# Patient Record
Sex: Male | Born: 1990 | Smoking: Never smoker
Health system: Southern US, Community
[De-identification: ages and names within clinical notes are randomized; demographics above are authoritative.]

## PROBLEM LIST (undated history)

## (undated) DIAGNOSIS — M8430XA Stress fracture, unspecified site, initial encounter for fracture: Secondary | ICD-10-CM

## (undated) DIAGNOSIS — S93409A Sprain of unspecified ligament of unspecified ankle, initial encounter: Secondary | ICD-10-CM

## (undated) DIAGNOSIS — S6290XA Unspecified fracture of unspecified wrist and hand, initial encounter for closed fracture: Secondary | ICD-10-CM

## (undated) DIAGNOSIS — S8390XA Sprain of unspecified site of unspecified knee, initial encounter: Secondary | ICD-10-CM

## (undated) DIAGNOSIS — S060XAA Concussion with loss of consciousness status unknown, initial encounter: Secondary | ICD-10-CM

## (undated) DIAGNOSIS — S93609A Unspecified sprain of unspecified foot, initial encounter: Secondary | ICD-10-CM

## (undated) DIAGNOSIS — R21 Rash and other nonspecific skin eruption: Secondary | ICD-10-CM

## (undated) DIAGNOSIS — S63509A Unspecified sprain of unspecified wrist, initial encounter: Secondary | ICD-10-CM

## (undated) DIAGNOSIS — S46119A Strain of muscle, fascia and tendon of long head of biceps, unspecified arm, initial encounter: Secondary | ICD-10-CM

## (undated) HISTORY — DX: Sprain of unspecified ligament of unspecified ankle, initial encounter: S93.409A

## (undated) HISTORY — DX: Stress fracture, unspecified site, initial encounter for fracture: M84.30XA

## (undated) HISTORY — DX: Strain of muscle, fascia and tendon of long head of biceps, unspecified arm, initial encounter: S46.119A

## (undated) HISTORY — DX: Unspecified fracture of unspecified hand, initial encounter for closed fracture: S62.90XA

## (undated) HISTORY — DX: Unspecified sprain of unspecified foot, initial encounter: S93.609A

## (undated) HISTORY — DX: Unspecified sprain of unspecified wrist, initial encounter: S63.509A

## (undated) HISTORY — DX: Concussion with loss of consciousness status unknown, initial encounter: S06.0XAA

## (undated) HISTORY — DX: Sprain of unspecified site of unspecified knee, initial encounter: S83.90XA

## (undated) HISTORY — DX: Rash and other nonspecific skin eruption: R21

---
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
## 2016-10-17 ENCOUNTER — Other Ambulatory Visit: Payer: Self-pay | Admitting: Medical

## 2016-11-04 ENCOUNTER — Inpatient Hospital Stay
Payer: BC Managed Care – PPO | Attending: Orthopaedic Surgery | Admitting: Rehabilitative and Restorative Service Providers"

## 2016-11-04 DIAGNOSIS — M25571 Pain in right ankle and joints of right foot: Secondary | ICD-10-CM

## 2016-11-04 NOTE — PT/OT Therapy Note (Signed)
INITIAL EVALUATION (Ankle/Foot)    Name: David Macias Age: 25 y.o. Occupation: [employer] SOC: 11/04/16  Referring Physician: Madelin Headings, MD MD recheck: n/a DOS:   DOI: Onset of Problem / Injury: 09/12/16  # of Authorized Visits:   Visit #     Diagnosis (Treating/Medical):     ICD-10-CM    1. Acute right ankle pain M25.571         SUBJECTIVE:    Mechanism of Injury: Patient presents to the clinic with a chief complaint of right ankle pain. He was playing soccer in early October and sprained his left ankle. 3-4 days later his right ankle started hurting. He denies any trauma or injury to the right ankle. He thinks it may be due to overcompensation. The left ankle is fine now. In 2011 he had an ankle injury which required surgery on his left ankle (left subluxing peroneal tendon repair fibular osteotomy) and feels as if his symptoms in the right ankle are similar to that time. He was in a walking boot for 4 weeks and is out of it now. He did have an MRI which did not show any tearing or indications for surgery.     Pain location: lateral malleolus     Pain description: moments of sharp pain    Aggravating factors: lateral mobility, walking on uneven surfaces, sitting indian style criss-cross    Relieving factors: none/not needed    Functional limitations: patient is apprehensive with daily motions and is relatively limited for that reason. He is avoiding high level activity like running and playing sports to prevent further injury. (soccer player)    Patient's reason for seeking PT /Functional Limitations (PLOF): to get back to running and hiking without issues    Past Medical History: No past medical history on file.  Medications: No outpatient prescriptions have been marked as taking for the 11/04/16 encounter (Clinical Support) with Raechel Chute, PT.        Other Treatment/Prior Therapy: No  Prior Hospitalization: No  Hand Dominance: Dominant Hand: Right Involved Side: Involved Side: Right    DiagnosticTests: negative MRI    Outcome Measure:                         FAAM R Score: 68                % Pain Score: 10% Rate Satisfaction with Current Function: 3/10   Living Environment: Type of Residence: One story home/apartment      Dwelling Entrance:     Patient lives with: Living Arrangements: Roommate    OBJECTIVE:    Vitals:          Observation/Posture/Gait/Integumentary:  Observation of posture: Within Functional Limitations  Ambulation: without AD  Gait: Within functional limits  Integumentary: No wound, lesion or rash noted  Palpation: Pain to palpation: right ATFL and proximal extensor digitorum tendons as well as posterior tibialis muscle  Joint Mobility Assessment: hypomobile distal tib-fib with AP/PA with pain  End feel: Firm    Girth/Edema: None noted   Initial R  Initial L   Bimalleolar      Figure 8      Midfoot      Forefoot      (blank fields were intentionally left blank)    Range of Motion: (degrees)  Initial  Right  AROM Initial  Right PROM   Right AROM   Right PROM Ankle Initial  Left AROM  Initial  Left PROM   Left AROM   Left PROM   13    Dorsiflexion 12      59    Plantarflexion 64      32    Inversion 33      18    Eversion 18          Knee Flexion           Knee Extension       (blank fields were intentionally left blank)    Hip AROM: WFL  Knee AROM: WFL    Strength:   Initial  R   R LE Strength  MMT /5 Initial  L   L    5  Ankle Dorsiflexion 5    5  Ankle Plantarflexion 5    5  Ankle Inversion 5    5  Ankle Eversion 5      Quadriceps       Hamstrings     4  Hip Abduction 4    (blank fields were intentionally left blank)    Flexibility:    Comment:   Hamstrings NT    Quadriceps NT    Piriformis NT    ITBand NT    Iliopsoas NT    Gastroc NT      Functional Strength:   Sit to Stand: Independent  Squat:  NT  Step-up: NT  Heel Raises:  Bilateral: NT    Unilateral: R: 8 P! L: 25    Balance:  SLS R: Eyes Open (EO): 30 sec. Eyes Closed (EC): 30 sec.  SLS L: Eyes Open (EO): 30 sec. Eyes  Closed (EC): 30 sec.    Special Tests/Neurological Screen:   R L  R L   Anterior Drawer NT NT Windlass - -   Posterior Drawer NT NT Navicular Drop - -   Talar Tilt NT NT Thompsons - -   Valgus Stress NT NT  NT NT   Varus Stress NT NT        Deep Tendon Reflex R L   L3-L4 Patella NT NT   S1 Achilles NT NT    NT NT     Sensation to Light touch: Intact    Treatment Initial Visit:  Evaluation   Patient Education on PT, POC, and HEP  Therapeutic exercise with instruction in HEP and provided patient written and illustrated handout Yes  Therapeutic Activity: N/A  Manual: N/A  Modalities: None  Barriers to rehabilitation: None  Is patient aware of diagnosis: Yes  For Next Visit Add manual and therex as indicated      Margaretmary Dys, PT, DPT [phone] 11/04/2016      11/04/2016   Total Treatment (billable)Time: 40  Total Timed Minutes:  10    Plan of Care [facility] Medicare Provider #: [phone]                Patient Name: David Macias  MRN: [id number]  DOI: Onset of Problem / Injury: 09/12/16 DOS: N/A  SOC:11/04/16    Diagnosis:     ICD-10-CM    1. Acute right ankle pain M25.571        ASSESSMENT: the patient is a 25 y.o. male presenting with right ankle pain consistent with his referral of ankle sprain who requires Physical Therapy for the following:  Impairments: soft-tissue restrictions over the ATFL and proximal extensor digitorum, hypomobile distal tib-fib with pain, trigger points in the posterior tibialis, glute weakness    Barriers to  Rehabilitations/Comorbidities/personal  factors:  none    Pain located: right lateral ankle    Clinical presentation: stable     Functional Limitations (PLOF): patient is apprehensive with daily motions and is relatively limited for that reason. He is avoiding high level activity like running and playing sports to prevent further injury. (soccer player)    Plan Of Care: Body Mechanics Education, NMR, Proprioceptive Activites, Instruction in HEP, Therapeutic Exercise, Therapeutic Activities,  Balance/Gait training and Soft Tissue/Joint Mobilization grades 1-4 to right ankle/foot as indicated    Frequency/Duration: 2 times a week for 12 sessions. Certification Status Ends: 01/02/17    Goals:  Date (Body Area, Impairment Goal, Functional   Activity, Target Performance) Time Frame Status Date/  Initial   11/04/16   Patient will demonstrate independence in prescribed HEP with proper form, sets and reps for safe discharge to an independent program.  12 sessions Initial Eval    11/04/16   Improve FAAM to 84  12 sessions Initial Eval    11/04/16   Patient will be able to perform typical ADL's without any apprehension including walking/commuting to work.  10 sessions Initial Eval    11/04/16   Patient will be able to negotiate hills of uneven surfaces in order to hike without restriction.  12 sessions Initial Eval    11/04/16   Patient will be able to perform SL hopping drills in order to participate in sports.  10 sessions Initial Eval      Signature: Margaretmary Dys, [place], [place] [id number] 11/04/2016     Date: 11/04/2016    Signature: Madelin Headings, MD _______________________  Date:  ________________     Patient Name: David Macias  MRN: [id number]

## 2016-11-04 NOTE — PT/OT Exercise Plan (Signed)
Name: David Macias  Referring Physician: Madelin Headings, MD  Diagnosis:     ICD-10-CM    1. Acute right ankle pain M25.571         Precautions:   Date of Surgery:    MD Follow-up:           Exercise Flow Sheet    Exercise Specifics 11/04/16             Bike                  SL heel raises  15x  ak             Hip abd  10x  ak             clamshell    10x  ak             gastroc/soleus stretch               Foam roll   calf                                                                                                   Home Exercise Program                 (Initials = supervised exercise by clinician)    [name]  Access Code: [email or web address]   URL: [email or web address]   Date: 11/04/2016   Prepared by: Margaretmary Dys     Exercises   Single Leg Heel Raise - 15 reps - 3 sets - 2x daily - 7x weekly   Sidelying Hip Abduction - 15 reps - 3 sets - 2x daily - 7x weekly   Clamshell - 15 reps - 3 sets - 2x daily - 7x weekly

## 2016-11-06 ENCOUNTER — Inpatient Hospital Stay
Payer: BC Managed Care – PPO | Attending: Orthopaedic Surgery | Admitting: Rehabilitative and Restorative Service Providers"

## 2016-11-06 DIAGNOSIS — M25571 Pain in right ankle and joints of right foot: Secondary | ICD-10-CM

## 2016-11-06 NOTE — PT/OT Therapy Note (Signed)
DAILY NOTE   11/06/2016        Total Treatment (billable)Time: 35   Total Timed Minutes:  35 Visit Number:  2/12      Payor: OUT OF STATE BLUE CROSS / Plan: BCBS OUT OF STATE / Product Type: *No Product type* /    # of Authorized Visits:   Visit #:        Diagnosis (Treating/Medical):     ICD-10-CM    1. Acute right ankle pain M25.571            Subjective:  David Macias reports he got a chance to do his HEP and noticed the hip weakness.   Functional Status: same as above    Objective:   Treatment:  Therapeutic Exercise: to improve: Balance, Flexibility/ROM, Stabilization and Strength   Warm-up:  None today -   Modifications/Patient Education:  Verbal, Visual and Tactile cues for therex  PROM rear foot into inversion/eversion with end-range OP    NMR:    SLS on airex - progression from EO to EC  SL hip/ankle strategy    Therapeutic Activities:    ---    Manual Therapy:   AP/PA to proximal and distal tib-fib joints grades 3-4      Initial Evaluation Reference and/orCurrent Measurements (ROM, Strength, Girth, Outcomes, etc.):      Range of Motion: (degrees)  Initial  Right  AROM Initial  Right PROM    Right AROM    Right PROM Ankle Initial  Left AROM Initial  Left PROM    Left AROM    Left PROM   13       Dorsiflexion 12         59       Plantarflexion 64         32       Inversion 33         18       Eversion 18                 Knee Flexion                   Knee Extension           (blank fields were intentionally left blank)      Strength:   Initial  R    R LE Strength  MMT /5 Initial  L    L    5   Ankle Dorsiflexion 5     5   Ankle Plantarflexion 5     5   Ankle Inversion 5     5   Ankle Eversion 5         Quadriceps           Hamstrings       4   Hip Abduction 4     (blank fields were intentionally left blank)      Modalities: None  Therapy Rationale: Other:         Assessment (response to treatment):   David Macias tolerated the session well. He has some posterior pain behind the distal tib-fib joint which may be as a  result of hypomobility. Following mobilizations he had less pain with OP in dorsiflexion. He has pretty good balance but may benefit from proprioception training. Patient arrived 10 mins tardy limiting the session today.     Progress towards functional goals:    Goals:  Date (Body Area, Impairment Goal, Functional   Activity, Target Performance)  Time Frame Status Date/  Initial   11/04/16   Patient will demonstrate independence in prescribed HEP with proper form, sets and reps for safe discharge to an independent program.  12 sessions In progress 11/06/16  ak   11/04/16   Improve FAAM to 84  12 sessions Initial Eval     11/04/16   Patient will be able to perform typical ADL's without any apprehension including walking/commuting to work.  10 sessions Initial Eval     11/04/16   Patient will be able to negotiate hills of uneven surfaces in order to hike without restriction.  12 sessions Initial Eval     11/04/16   Patient will be able to perform SL hopping drills in order to participate in sports.  10 sessions Initial Eval        Patient requires continued skilled care to achieve the following Functional Limitations (PLOF): patient is apprehensive with daily motions and is relatively limited for that reason. He is avoiding high level activity like running and playing sports to prevent further injury. (soccer player)    Plan:  Continue with Plan of Care. Continue with joint mobilizations as tolerated.     Margaretmary Dys, PT, DPT [phone] 11/06/2016    Frequency/Duration: 2 times a week for 12 sessions.        Certification Status Ends: 01/02/17

## 2016-11-06 NOTE — PT/OT Exercise Plan (Signed)
Name: David Macias  Referring Physician: Madelin Headings, MD  Diagnosis:     ICD-10-CM    1. Acute right ankle pain M25.571         Precautions:   Date of Surgery:    MD Follow-up:           Exercise Flow Sheet    Exercise Specifics 11/04/16 11/06/16            Bike                  SL heel raises  15x  ak             Hip abd  10x  ak             clamshell    10x  ak             gastroc/soleus stretch 1/2 foam  CKC DF 30x  ak             Foam roll   calf              baps     10x CW and CCW                                                                                Home Exercise Program                 (Initials = supervised exercise by clinician)    [name]  Access Code: [email or web address]   URL: [email or web address]   Date: 11/04/2016   Prepared by: Margaretmary Dys     Exercises   Single Leg Heel Raise - 15 reps - 3 sets - 2x daily - 7x weekly   Sidelying Hip Abduction - 15 reps - 3 sets - 2x daily - 7x weekly   Clamshell - 15 reps - 3 sets - 2x daily - 7x weekly

## 2016-11-07 HISTORY — PX: CYSTECTOMY, PILONIDAL: SHX3546

## 2016-11-11 ENCOUNTER — Inpatient Hospital Stay: Payer: BC Managed Care – PPO

## 2016-11-13 ENCOUNTER — Inpatient Hospital Stay: Payer: BC Managed Care – PPO | Attending: Orthopaedic Surgery

## 2016-11-13 DIAGNOSIS — M25571 Pain in right ankle and joints of right foot: Secondary | ICD-10-CM

## 2016-11-13 NOTE — PT/OT Exercise Plan (Signed)
Name: [name]  Referring Physician: Madelin Headings, MD  Diagnosis:     ICD-10-CM    1. Acute right ankle pain M25.571         Precautions:   Date of Surgery:    MD Follow-up:           Exercise Flow Sheet    Exercise Specifics 11/04/16 11/06/16 11/13/16           Bike       -           SL heel raises  15x  ak  DL HR off step  [id number]  ck           Hip abd  10x  ak  GTB s/s x2 laps  ck           clamshell    10x  ak  -           gastroc/soleus stretch 1/2 foam  CKC DF 30x  ak  2x30" ea  ck           Foam roll   calf   B calf x2'  ck           baps     10x CW and CCW R foot diagonal on tilt board   w/ taps   x1'  ck                 SLS on airex+ball toss x30  ck                 SL cone touch x15  ck                                             Home Exercise Program                 (Initials = supervised exercise by clinician)    [name]  Access Code: [email or web address]   URL: [email or web address]   Date: 11/04/2016   Prepared by: Margaretmary Dys     Exercises   Single Leg Heel Raise - 15 reps - 3 sets - 2x daily - 7x weekly   Sidelying Hip Abduction - 15 reps - 3 sets - 2x daily - 7x weekly   Clamshell - 15 reps - 3 sets - 2x daily - 7x weekly

## 2016-11-13 NOTE — PT/OT Therapy Note (Signed)
DAILY NOTE   11/13/2016        Total Treatment (billable)Time: 45' Total Timed Minutes:  1' Visit Number:  3/12      Payor: OUT OF STATE BLUE CROSS / Plan: BCBS OUT OF STATE / Product Type: *No Product type* /    # of Authorized Visits:   Visit #:        Diagnosis (Treating/Medical):     ICD-10-CM    1. Acute right ankle pain M25.571        Subjective:  David Macias reports he was sore for about 4 days since his last appointment, ankle is feeling better today.   Functional Status: same as above    Objective:   Treatment:  Therapeutic Exercise: to improve: Balance, Flexibility/ROM, Stabilization and Strength   Warm-up:  None today -   Modifications/Patient Education:  Verbal, Visual and Tactile cues for therex  PROM of ankle in all dir to tolerance; asst GN stretch in long sit    NMR:    sls on airex+ball tosses for proprioception and balance  Tilt board for stabilization and motor control    Therapeutic Activities:    ---    Manual Therapy:   AP/PA to distal tib-fib joints grades 3-4      Initial Evaluation Reference and/orCurrent Measurements (ROM, Strength, Girth, Outcomes, etc.):      Range of Motion: (degrees)  Initial  Right  AROM Initial  Right PROM    Right AROM    Right PROM Ankle Initial  Left AROM Initial  Left PROM    Left AROM    Left PROM   13       Dorsiflexion 12         59       Plantarflexion 64         32       Inversion 33         18       Eversion 18                 Knee Flexion                   Knee Extension           (blank fields were intentionally left blank)      Strength:   Initial  R    R LE Strength  MMT /5 Initial  L    L    5   Ankle Dorsiflexion 5     5   Ankle Plantarflexion 5     5   Ankle Inversion 5     5   Ankle Eversion 5         Quadriceps           Hamstrings       4   Hip Abduction 4     (blank fields were intentionally left blank)      Modalities: None  Therapy Rationale: Other:         Assessment (response to treatment):   David Macias tolerated the session well. He had some tenderness  during tib-fib mobilizations but overall tolerable. No significant pain with exercises today but challenged with some fatigue during sls on airex. He was encouraged to apply ice to his ankle at home to reduce soreness after PT sessions.     Progress towards functional goals:    Goals:  Date (Body Area, Impairment Goal, Functional   Activity, Target Performance) Time Frame  Status Date/  Initial   11/04/16   Patient will demonstrate independence in prescribed HEP with proper form, sets and reps for safe discharge to an independent program.  12 sessions In progress 11/06/16  ak   11/04/16   Improve FAAM to 84  12 sessions Initial Eval     11/04/16   Patient will be able to perform typical ADL's without any apprehension including walking/commuting to work.  10 sessions Initial Eval     11/04/16   Patient will be able to negotiate hills of uneven surfaces in order to hike without restriction.  12 sessions Initial Eval     11/04/16   Patient will be able to perform SL hopping drills in order to participate in sports.  10 sessions Initial Eval        Patient requires continued skilled care to achieve the following Functional Limitations (PLOF): patient is apprehensive with daily motions and is relatively limited for that reason. He is avoiding high level activity like running and playing sports to prevent further injury. (soccer player)    Plan:  Continue with Plan of Care. Continue with joint mobilizations as tolerated.     John Giovanni, [place]  [id number] 11/13/2016    Frequency/Duration: 2 times a week for 12 sessions.        Certification Status Ends: 01/02/17

## 2016-11-18 ENCOUNTER — Inpatient Hospital Stay: Payer: BC Managed Care – PPO

## 2016-11-21 ENCOUNTER — Inpatient Hospital Stay: Payer: BC Managed Care – PPO | Admitting: Rehabilitative and Restorative Service Providers"

## 2016-11-26 ENCOUNTER — Inpatient Hospital Stay: Payer: BC Managed Care – PPO

## 2017-02-05 IMAGING — CT CT ABD-PELV W/ CM
2 of 4 series · 15 of 46 positions shown, 17 images · IV contrast (APPLIED)
Comparison: None.

CLINICAL DATA: Left lower quadrant pain for 4 weeks

EXAM:
CT ABDOMEN AND PELVIS WITH CONTRAST
TECHNIQUE: Multidetector CT imaging of the abdomen and pelvis was performed
using the standard protocol following bolus administration of
intravenous contrast.
CONTRAST:  100mL [id number] IOPAMIDOL ([id number]) INJECTION 61%

[Series 2: abd/pelvis w/cm · axial · 0.66mm/px · z∈[-452,-67]mm · 12 of 93 slices shown, 14 images]
[im 8/93  soft-tissue]
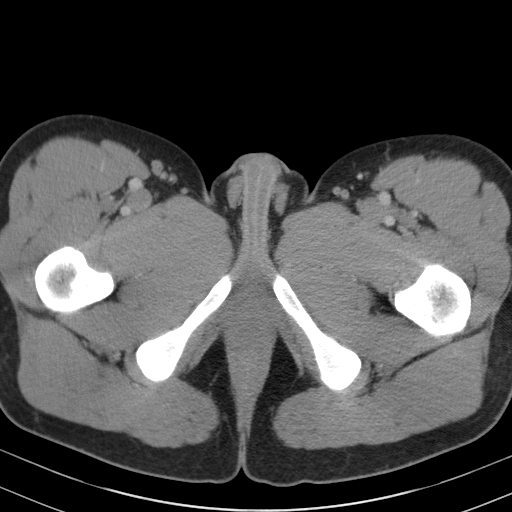
[im 8/93  bone]
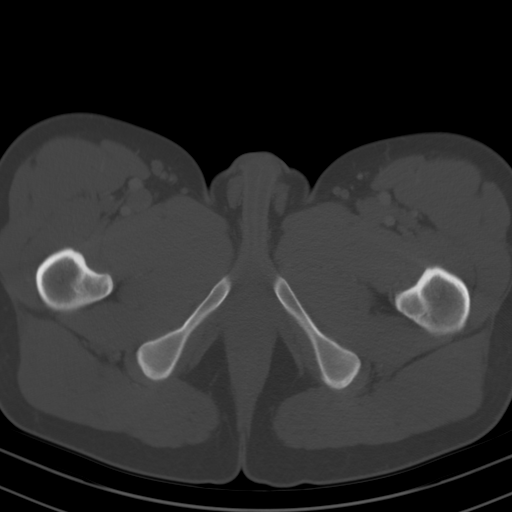
[im 15/93  soft-tissue]
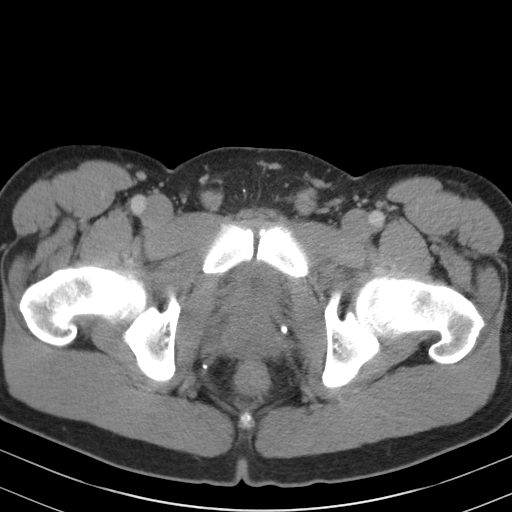
[im 22/93  soft-tissue]
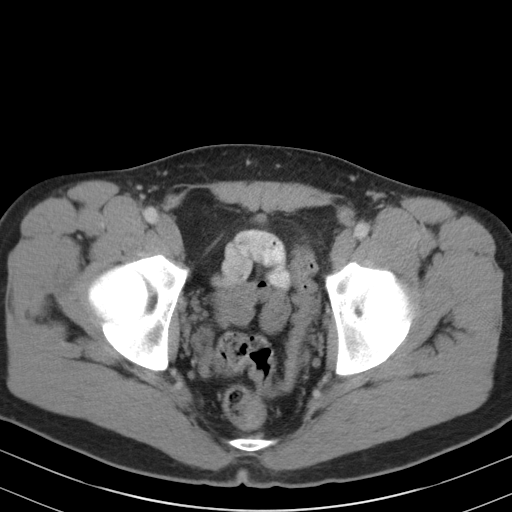
[im 29/93  soft-tissue]
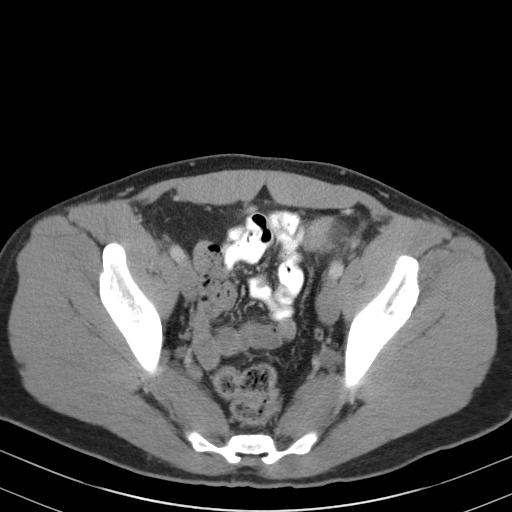
[im 36/93  soft-tissue]
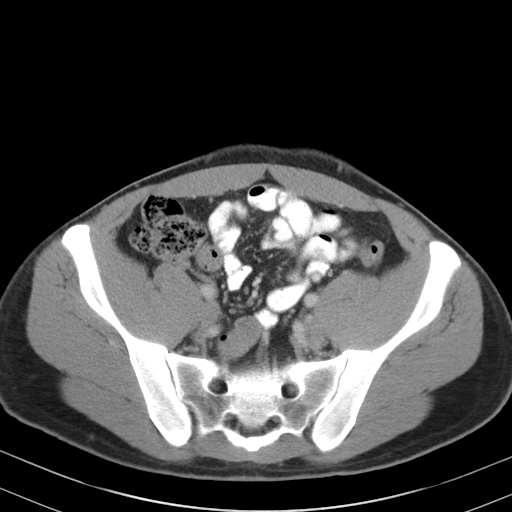
[im 43/93  soft-tissue]
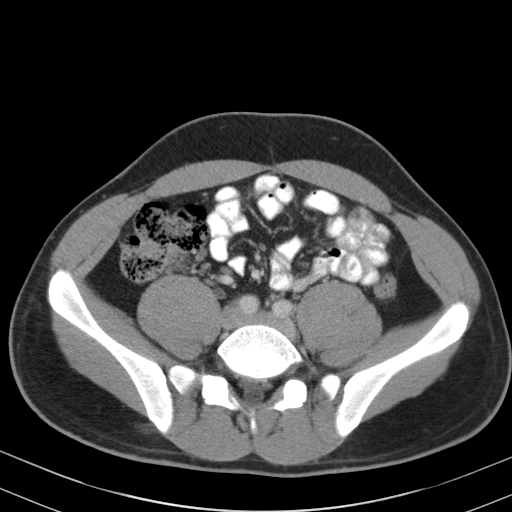
[im 50/93  soft-tissue]
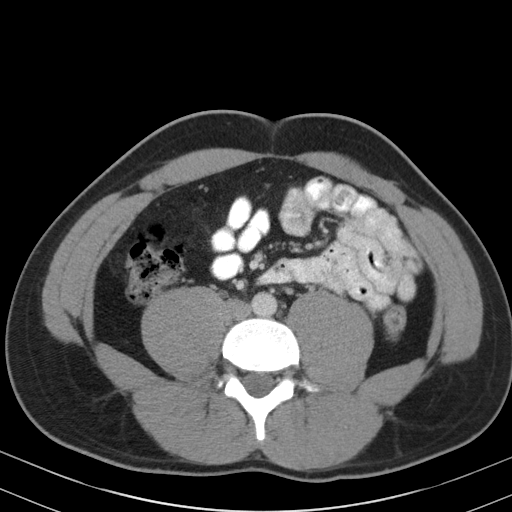
[im 57/93  soft-tissue]
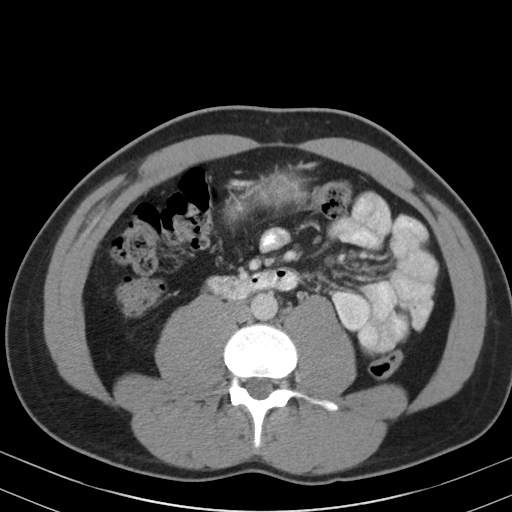
[im 64/93  soft-tissue]
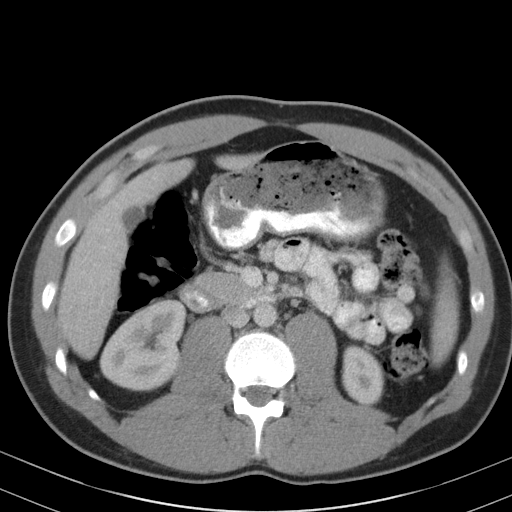
[im 64/93  bone]
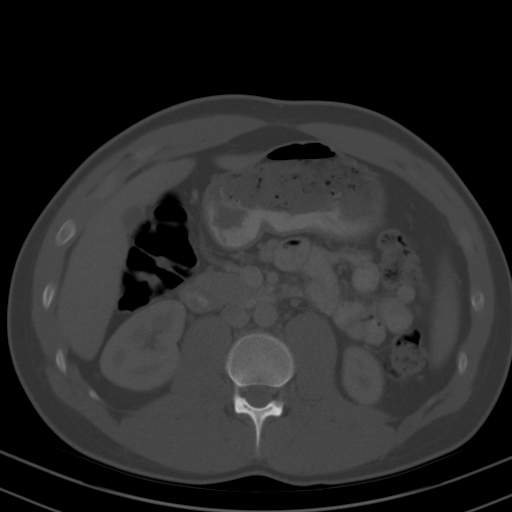
[im 71/93  soft-tissue]
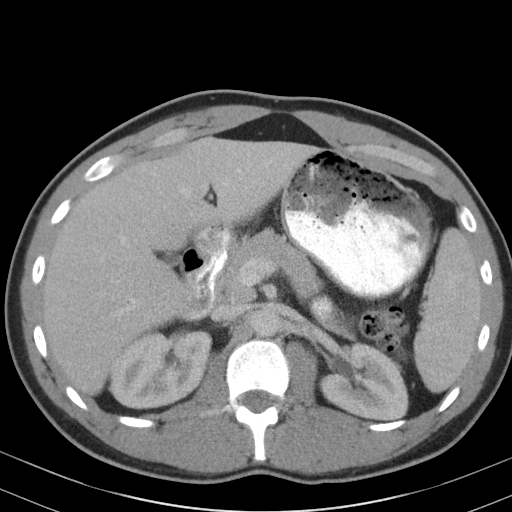
[im 78/93  soft-tissue]
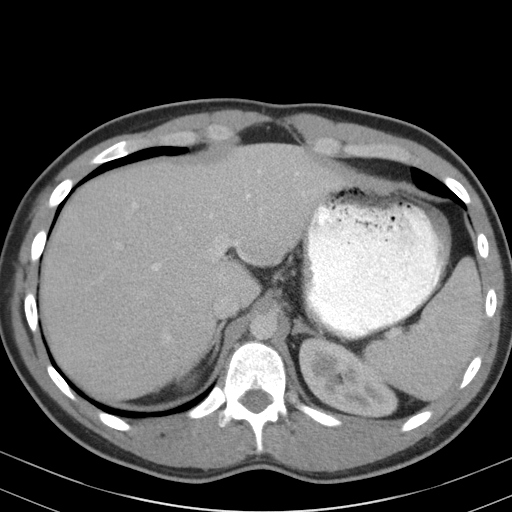
[im 85/93  soft-tissue]
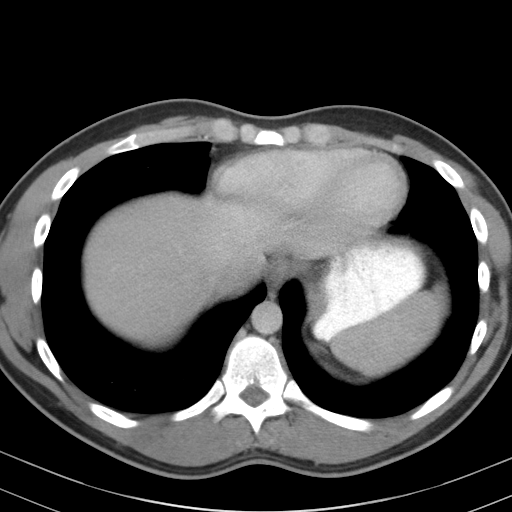

[Series 3: cor · coronal · 0.69mm/px · 3 of 89 slices shown]
[im 30/89  soft-tissue]
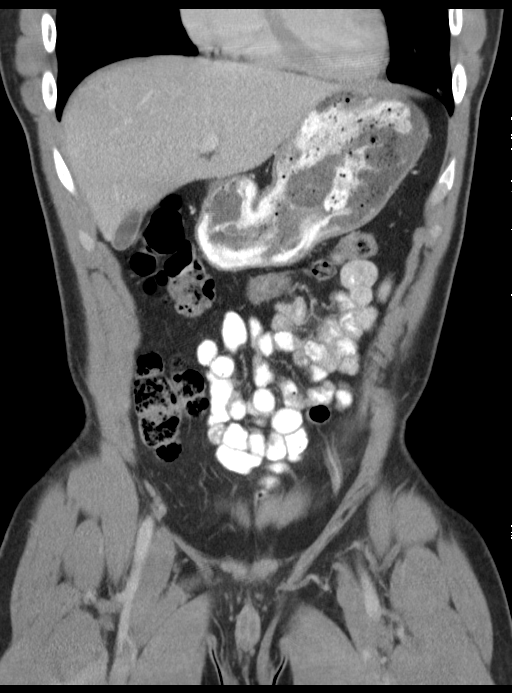
[im 40/89  soft-tissue]
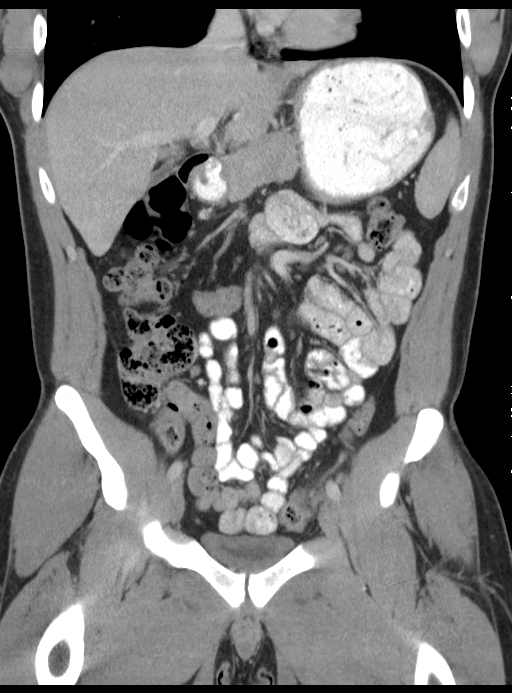
[im 49/89  soft-tissue]
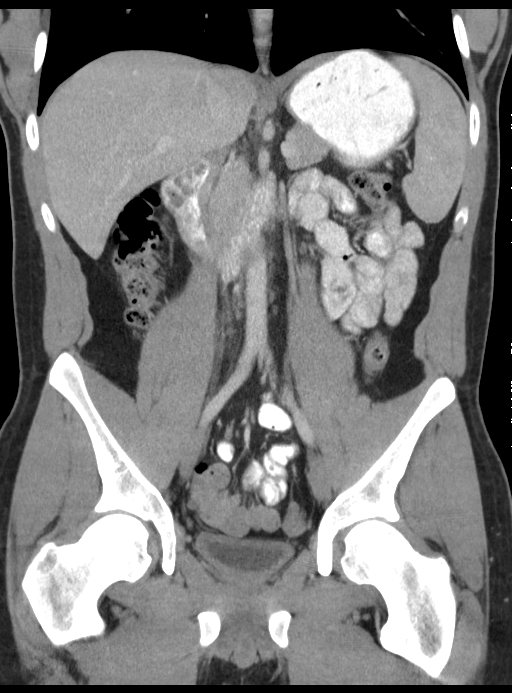

[15 of 46 positions shown; findings below may reference images not displayed]

FINDINGS: Lung bases are unremarkable. Sagittal images of the spine are
unremarkable.

Enhanced liver is unremarkable. Gallbladder is contracted without
evidence of calcified gallstones.

Enhanced pancreas, spleen and adrenal glands are unremarkable.
Abdominal aorta is unremarkable. There is no gastric outlet
obstruction.

Enhanced kidneys are symmetrical in size. No hydronephrosis or
hydroureter. No thickened or dilated small bowel loops are noted.

The terminal ileum is unremarkable. Moderate stool noted in right
colon cecum. No pericecal inflammation. Small nonspecific lymph
nodes are noted in right lower quadrant mesentery. Normal appendix
is partially visualized in axial image 57 and sagittal image 43.

In axial image 67 there is focal inflammatory process in left lower
quadrant mesentery just inferolateral to proximal sigmoid colon.
There is ring like stranding of mesenteric fat with ovoid central
fat. Measures about 1.6 x 1.2 cm. This is best seen in coronal image
35. Small amount of fluid is noted in adjacent posterolateral
paracolic gutter. This most likely represents epiploic appendagitis,
focal mesenteric panniculitis or less likely diverticulitis or focal
colitis. There is no evidence of mesenteric or diverticular abscess.
No significant colonic diverticula are noted. No evidence of free
abdominal air.

There is no distal colonic obstruction. Prostate gland and seminal
vesicles are unremarkable. There is nonspecific thickening of
urinary bladder wall. Although the urinary bladder is under
distended cystitis cannot be excluded. Clinical correlation is
necessary.
IMPRESSION: 1. In axial image 67 there is focal inflammatory process in left
lower quadrant mesentery just inferolateral to proximal sigmoid
colon. There is ring like stranding of mesenteric fat with ovoid
central fat. Measures about 1.6 x 1.2 cm. This is best seen in
coronal image 35. Small amount of fluid is noted in adjacent
posterolateral paracolic gutter. This most likely represents
epiploic appendagitis, focal mesenteric panniculitis or less likely
diverticulitis or focal colitis. There is no evidence of mesenteric
or diverticular abscess.
2. Nonspecific mild thickening of urinary bladder wall. Mild
cystitis cannot be excluded.
3. Moderate stool noted in right colon and cecum. No pericecal
inflammation. Normal appendix partially visualized.
4. No hydronephrosis or hydroureter.
5. No small bowel obstruction.

## 2017-07-31 ENCOUNTER — Other Ambulatory Visit (INDEPENDENT_AMBULATORY_CARE_PROVIDER_SITE_OTHER): Payer: Self-pay | Admitting: Family Medicine

## 2017-07-31 LAB — COMPREHENSIVE METABOLIC PANEL
ALT: 17 U/L (ref 10–50)
AST (SGOT): 17 U/L (ref 0–40)
African American eGFR: 106.23
Albumin: 4.7 g/dL (ref 3.5–5.2)
Alkaline Phosphatase: 50 U/L (ref 35–130)
BUN / Creatinine Ratio: 12.9
BUN: 13 mg/dL (ref 6–23)
Bilirubin, Total: 1 mg/dL (ref 0.0–1.2)
CO2: 25 mmol/L (ref 19–31)
Calcium: 9.6 mg/dL (ref 8.6–10.2)
Chloride: 104 mEq/l (ref 97–107)
Creatinine: 1.04 mg/dL (ref 0.70–1.20)
Glucose: 88 mg/dL (ref 74–106)
Potassium: 4.4 mmol/L (ref 3.5–5.1)
Protein, Total: 6.82 g/dL (ref 6.30–8.70)
Sodium: 139 mmol/L (ref 135–145)
non-African American eGFR: 87.79

## 2017-07-31 LAB — LIPID PANEL
Cholesterol / HDL Ratio: 3.5 (ref 0.0–3.6)
Cholesterol: 167 mg/dL (ref 0–200)
HDL: 48 mg/dL (ref >40)
LDL Calculated: 105 mg/dL (ref 0–129)
NON HDL CHOLESTEROL: 120 mg/dL
Triglycerides: 75 mg/dL (ref 0–150)

## 2017-09-03 ENCOUNTER — Inpatient Hospital Stay: Payer: BC Managed Care – HMO | Attending: Family Medicine

## 2017-09-03 DIAGNOSIS — S86899D Other injury of other muscle(s) and tendon(s) at lower leg level, unspecified leg, subsequent encounter: Secondary | ICD-10-CM | POA: Insufficient documentation

## 2017-09-03 NOTE — Progress Notes (Signed)
Name: David Macias Age: 26 y.o.   Referring Physician: Miles Costain, *   Date of Injury: 07/31/2017  Date Care Plan Established/Reviewed: 09/03/2017  Date Treatment Started: 09/03/2017  Visit Count: 1   Diagnosis:   1. Shin splints, subsequent encounter        Subjective     History of Present Illness   Mechanism of injury: Insidious  History of Present Illness: Patient reports he has been battling shin splint for years. Has gotten X rays; which were unremarkable. Currently not running but would like to start; plays basketball, soccer. Would like to do 5 k's. No paresthesia in LE. During workout may feel sharp pain in both shin and then dull ache thoroughout. Does dynamic warm ups and stretches after his workouts. Use to wear custom inserts (custom made); not wearing them anymore.   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 Stand: independent  Squat: full (SL squats full; loss of balance on R)    Balance   Left   Eyes Open (Left)(sec)      Trial 1: 30 seconds  Right   Eyes Open (Right)(sec)      Wobbles more on R; lateral column loading     Trial 1: 30 seconds    Gait   Within Functional Limits    Integumentary   no wound, lesion or rash noted    Range of Motion   Left Hip AROM WFL Right Hip AROM WFL Left Knee AROM WFL Right Knee AROM WFL       Left AROM    Left PROM   Ankle/Foot    Right AROM    Right PROM   20    Dorsiflexion 20      55    Plantarflexion 60      42    Ankle Inversion 41      4    Ankle Eversion 10      (blank fields were intentionally left blank)      Strength        Left Strength  Hip  MMT    Right    5 Hip Flexion  5    4+ Hip Extension  4+    4 Hip Abduction  4     Hip Adduction       Hip IR      5 Hip ER  4+    5  Quadriceps  5    5 Hamstrings  5   (blank fields were intentionally left blank)       Left Strength  Ankle/Foot  MMT    Right    5 Ankle Dorsiflexion  5     Ankle Plantarflexion      5 Ankle Inversion  5    5 Ankle Eversion  5   (blank fields were intentionally left blank)    Palpation Medial gastroc tenderness             Treatment     Therapeutic Exercises   Justification: Create and updated HEP  [name]   Access Code: [email or web address]   URL: [email or web address]   Date: 09/03/2017  Prepared by: Harlon Ditty      Exercises Sidelying Hip Abduction - 10 reps - 3 sets - 4x weekly   Single Leg Bridge - 15 reps - 1 sets - 4x weekly   Sidelying IT Band Foam Roll Mobilization - 1 reps - 1 sets - 4x weekly   Anterior Tibialis and Peroneal Mobilization with Foam Roll - 1 reps - 1 sets - 4x weekly   Calf Mobilization on Foam Roll - 1 reps - 1 sets - 4x weekly       Therapeutic Activity   Justification: Education  -Patient diagnosis, POC and importance of running mechanics and changing shoes every 400 miles       ---      ---   Total Time   Timed Minutes  20 minutes   Untimed Minutes  20 minutes   Total Time  40 minutes        Assessment   David Macias is a 26 y.o. male presenting with bilateral shin splints  who requires Physical Therapy for the following:  Impairments: glut weakness, R dynamic balance deficits, tenderness to palpation (medial calf)    Pain located: medial calf    Clinical presentation: stable   Barriers to therapy: none  Prior Level of Function: Running more than 1/2 mile causes pain  Prognosis: good  Plan   Visits per week: 2  Number of Sessions: 8  Direct One on One  16109: Therapeutic Exercise: To Develop Strength and Endurance, ROM and Flexibility  [id number]: Neuromuscular Reeducation  97140: Manual Therapy techniques (mobilization, manipulation, manual traction)  97530: Therapeutic Activities: Dynamic activities to improve functional performance  Dry Needling    Goals    Goal 1:  Patient  will demonstrate independence in prescribed HEP with proper form, sets and reps for safe discharge to an independent program.   Sessions:  4      Goal 2:  Increase glut max and med strength to 5/5 to optimize running form/efficency.   Sessions:  8      Goal 3:  Patient will be able to run for more than 1 mile with no reports of LE pain.   Sessions:  8      Goal 4:  Increase Foto FS from 77 to 85.   Sessions:  8                                Harlon Ditty, PT

## 2017-09-09 ENCOUNTER — Inpatient Hospital Stay: Payer: BC Managed Care – HMO | Attending: Family Medicine

## 2017-09-09 DIAGNOSIS — S86899D Other injury of other muscle(s) and tendon(s) at lower leg level, unspecified leg, subsequent encounter: Secondary | ICD-10-CM | POA: Insufficient documentation

## 2017-09-09 NOTE — PT/OT Therapy Note (Signed)
Name: David Macias Age: 26 y.o.   Referring Physician: Miles Costain, *   Date of Injury: 07/31/2017  Date Care Plan Established/Reviewed: 09/03/2017  Date Treatment Started: 09/03/2017  Visit Count: 2   Diagnosis:   1. Shin splints, subsequent encounter        Subjective     Social Support/Occupation      Occupation: desk job    Patient reports he has not been able to do HEP. Has been traveling a lot.                Treatment     Therapeutic Exercises   Justification: Warm up/stretches  [name]   Access Code: [email or web address]   Foam rolling anterior tib/peroneals  Stretches    Neuromuscular Re-Education   Justification: Improve hip/core stabilization  Glut arcs  SL bridges  Cone reach x10 ea  2 way hip BTB x20     Therapeutic Activity   Justification: Improve preloading and landing mechanics for running  -Running assessment with video technique  -Warm ups for jogging  -lunges       ---      ---   Total Time   Timed Minutes  43 minutes   Total Time  43 minutes       Strength        Left Strength  Hip  MMT    Right    5 Hip Flexion  5    4+ Hip Extension  4+    4 Hip Abduction  4     Hip Adduction       Hip IR      5 Hip ER  4+    5 Quadriceps  5    5 Hamstrings  5   (blank fields were intentionally left blank)       Left Strength  Ankle/Foot  MMT    Right    5 Ankle Dorsiflexion  5     Ankle Plantarflexion      5 Ankle Inversion  5    5 Ankle Eversion  5   (blank fields were intentionally left blank)      Assessment   David Macias tolerated the session well. Patient presents with overall soft landing, mid foot strike but tends to supinate when running which could be causing shin splints. Urged patient to purchase running shoes appropriate for foot. No tenderness reported along shin so emphasized exercises.   Plan   Continue with POC. If needed assess running mechanics again.      Goals    Goal 1:  Patient will demonstrate independence in prescribed HEP with proper  form, sets and reps for safe discharge to an independent program.   Sessions:  4      Goal 2:  Increase glut max and med strength to 5/5 to optimize running form/efficency.   Sessions:  8      Goal 3:  Patient will be able to run for more than 1 mile with no reports of LE pain.   Sessions:  8      Goal 4:  Increase Foto FS from 77 to 85.   Sessions:  8                              Name: David Macias  Referring Physician: Miles Costain, *  Diagnosis:     ICD-10-CM    1. Shin splints, subsequent encounter [phone]  Precautions:   Date of Surgery:    MD Follow-up:           Exercise Flow Sheet    Exercise Specifics 09/09/17             TM    6'  RV             3 way hip    BTB on airex pulses 2 way x20 ea  RV               S/S  GTB (feet) 2'  RV             SL abd    Glut arcs x20 ea  RV             bridges    SL bridge X10 RV               lunges  Kb #10 OH lunges x10 ea  RV               balance  Cone reach x10 ea  RV             Foam roller    3' ant tib/peroneals 2'  RV                                                                Home Exercise Program                 (Initials = supervised exercise by clinician)    David Macias, PT

## 2017-09-11 ENCOUNTER — Inpatient Hospital Stay: Payer: BC Managed Care – HMO | Attending: Family Medicine | Admitting: Rehabilitative and Restorative Service Providers"

## 2017-09-11 DIAGNOSIS — S86899D Other injury of other muscle(s) and tendon(s) at lower leg level, unspecified leg, subsequent encounter: Secondary | ICD-10-CM | POA: Insufficient documentation

## 2017-09-11 NOTE — PT/OT Therapy Note (Signed)
Name: David Macias Age: 26 y.o.   Referring Physician: Miles Costain, *   Date of Injury: 07/31/2017  Date Care Plan Established/Reviewed: 09/03/2017  Date Treatment Started: 09/03/2017  Visit Count: 3   Diagnosis:   1. Shin splints, subsequent encounter        Subjective     History of Present Illness   Functional Limitations (PLOF): Running more than 1/2 mile    Pain   Current pain rating: 1  Location: bilateral shins    Social Support/Occupation      Occupation: desk job    Patient reports he got new "stability" shoes which have helped some.                      Treatment     Therapeutic Exercises   Justification: Warm up/stretches  [name]  Access Code: [email or web address]     See flowsheet    Neuromuscular Re-Education   Justification: Facilitation via isometrics/istonics  Deep lateral rotators via clam  Cues during standing exercises to limit pronation    Manual Therapy   Justification: To improve soft-tissue mobility.     STM to bilateral posterior tib    Therapeutic Activity   Justification: Improve preloading and landing mechanics for running  SL heel raises   -cues for hip external rotation with great toe flexion   SL squat         ---      ---   Total Time   Timed Minutes  40 minutes   Total Time  40 minutes       Strength        Left Strength  Hip  MMT    Right    5 Hip Flexion  5    4+ Hip Extension  4+    4 Hip Abduction  4     Hip Adduction       Hip IR      5 Hip ER  4+    5 Quadriceps  5    5 Hamstrings  5   (blank fields were intentionally left blank)       Left Strength  Ankle/Foot  MMT    Right    5 Ankle Dorsiflexion  5     Ankle Plantarflexion      5 Ankle Inversion  5    5 Ankle Eversion  5   (blank fields were intentionally left blank)      Assessment   David Macias tolerated the session well. He has some soft-tissue restrictions of the posterior tibialis muscle and may benefit from continued mobilizations. He has notable weakness in his hip  external rotators and would benefit from glute and deep lateral rotator strengthening.  Prior Level of Function: Running more than 1/2 mile  Plan   Continue with hip strengthening and motor control.       Goals    Goal 1:  Patient will demonstrate independence in prescribed HEP with proper form, sets and reps for safe discharge to an independent program.   Sessions:  4      Goal 2:  Increase glut max and med strength to 5/5 to optimize running form/efficency.   Sessions:  8      Goal 3:  Patient will be able to run for more than 1 mile with no reports of LE pain.   Sessions:  8      Goal 4:  Increase Foto FS from 77 to 85.  Sessions:  8                              Name: David Macias  Referring Physician: Miles Costain, *  Diagnosis:     ICD-10-CM    1. Shin splints, subsequent encounter S86.899D         Precautions:   Date of Surgery:    MD Follow-up:           Exercise Flow Sheet    Exercise Specifics 09/09/17 09/11/17            TM    6'  RV             3 way hip    BTB on airex pulses 2 way x20 ea  RV               S/S  GTB (feet) 2'  RV             SL abd    Glut arcs x20 ea  RV             bridges    SL bridge X10 RV               lunges  Kb #10 OH lunges x10 ea  RV             Standing fire hydrant   Blue 3x15 each  ak              balance  Cone reach x10 ea  RV             Foam roller    3' ant tib/peroneals 2'  RV             clamshell     20x each  ak            SL heel raises   3x20 each  ak            SL squat     2x10 each  ak            Home Exercise Program                 (Initials = supervised exercise by clinician)    Raechel Chute, PT

## 2017-09-14 ENCOUNTER — Inpatient Hospital Stay: Payer: BC Managed Care – PPO

## 2017-09-16 ENCOUNTER — Inpatient Hospital Stay: Payer: BC Managed Care – HMO | Attending: Family Medicine

## 2017-09-16 DIAGNOSIS — S86899D Other injury of other muscle(s) and tendon(s) at lower leg level, unspecified leg, subsequent encounter: Secondary | ICD-10-CM | POA: Insufficient documentation

## 2017-09-16 NOTE — PT/OT Therapy Note (Signed)
Name: David Macias Age: 26 y.o.   Referring Physician: Miles Costain, *   Date of Injury: 07/31/2017  Date Care Plan Established/Reviewed: 09/03/2017  Date Treatment Started: 09/03/2017  Visit Count: 4   Diagnosis:   1. 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                       Treatment     Therapeutic Exercises   Justification: Warm up/stretches  [name]  Access Code: [email or web address]     See flowsheet    Neuromuscular Re-Education   Justification: For eccentric control  Heel raises off step  Tiltboard diagonal taps    Manual Therapy   Justification: To improve soft-tissue mobility.     STM to bilateral posterior tib    Therapeutic Activity   Justification: Improve preloading and landing mechanics for running  SL squats  S/s+mw         ---      ---   Total Time   Timed Minutes  43 minutes   Total Time  43 minutes       Strength        Left Strength  Hip  MMT    Right    5 Hip Flexion  5    4+ Hip Extension  4+    4 Hip Abduction  4     Hip Adduction       Hip IR      5 Hip ER  4+    5 Quadriceps  5    5 Hamstrings  5   (blank fields were intentionally left blank)       Left Strength  Ankle/Foot  MMT    Right    5 Ankle Dorsiflexion  5     Ankle Plantarflexion      5 Ankle Inversion  5    5 Ankle Eversion  5   (blank fields were intentionally left blank)      Assessment   David Macias tolerated the session well. He has tenderness in his posterior tib and could benefit from soft tissue mobilizations. He lacks eccentric control and reported muscle fatigue with exercises, however no reproduction of pain or symptoms.   Prior Level of  Function: Running more than 1/2 mile  Plan   Continue with hip strengthening and motor control.     Attempt gradual return to jogging when appropriate      Goals    Goal 1:  Patient will demonstrate independence in prescribed HEP with proper form, sets and reps for safe discharge to an independent program.   Sessions:  4      Goal 2:  Increase glut max and med strength to 5/5 to optimize running form/efficency.   Sessions:  8      Goal 3:  Patient will be able to run for more than 1 mile with no reports of LE pain.   Sessions:  8      Goal 4:  Increase Foto FS from 77 to 85.   Sessions:  8  Name: [name]  Referring Physician: Miles Costain, *  Diagnosis:     ICD-10-CM    1. Shin splints, subsequent encounter S86.899D         Precautions:   Date of Surgery:    MD Follow-up:           Exercise Flow Sheet    Exercise Specifics 09/09/17 09/11/17 09/16/17           TM    6'  RV  ET 3'  ck           3 way hip    BTB on airex pulses 2 way x20 ea  RV               S/S  GTB (feet) 2'  RV  BTB s/s+mw x2 laps  Ea  ck           SL abd    Glut arcs x20 ea  RV             bridges    SL bridge X10 RV               lunges  Kb #10 OH lunges x10 ea  RV             Standing fire hydrant   Blue 3x15 each  ak              balance  Cone reach x10 ea  RV  Diagonal tiltboard taps   2x1' B  ck           Foam roller    3' ant tib/peroneals 2'  RV  GN/soleus stretch on half FR 2x30" ea  ck           clamshell     20x each  ak            SL heel raises   3x20 each  ak 2 up/1 dwn HR off step x20 B  ck           SL squat     2x10 each  ak 2x15  ck           Home Exercise Program                 (Initials = supervised exercise by clinician)    Charlett Nose, PTA

## 2017-09-21 ENCOUNTER — Inpatient Hospital Stay: Payer: BC Managed Care – HMO | Attending: Family Medicine | Admitting: Rehabilitative and Restorative Service Providers"

## 2017-09-21 DIAGNOSIS — S86899D Other injury of other muscle(s) and tendon(s) at lower leg level, unspecified leg, subsequent encounter: Secondary | ICD-10-CM | POA: Insufficient documentation

## 2017-09-21 NOTE — PT/OT Therapy Note (Signed)
Name: David Macias Age: 26 y.o.   Referring Physician: Miles Costain, *   Date of Injury: 07/31/2017  Date Care Plan Established/Reviewed: 09/03/2017  Date Treatment Started: 09/03/2017  Visit Count: 5   Diagnosis:   1. 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 He's been taking it easy this week. He reports the deep tissue mobilization of the posterior tibialis helps.                       Treatment     Therapeutic Exercises   Justification: Warm up/stretches  [name]  Access Code: [email or web address]     See flowsheet    Neuromuscular Re-Education   Justification: To improve arch stability and control  Isotonic posterior tib contraction     Manual Therapy   Justification: To improve soft-tissue mobility.     STM/DTM to bilateral posterior tib  Active release with deep pressure and active PF & inversion       ---      ---   Total Time   Timed Minutes  45 minutes   Total Time  45 minutes       Strength        Left Strength  Hip  MMT    Right    5 Hip Flexion  5    4+ Hip Extension  4+    4 Hip Abduction  4     Hip Adduction       Hip IR      5 Hip ER  4+    5 Quadriceps  5    5 Hamstrings  5   (blank fields were intentionally left blank)       Left Strength  Ankle/Foot  MMT    Right    5 Ankle Dorsiflexion  5     Ankle Plantarflexion      5 Ankle Inversion  5    5 Ankle Eversion  5   (blank fields were intentionally left blank)      Assessment   Sorren tolerated the session well. He continues to have some tenderness in the posterior tib. He will continue to foam roll to self-manage. He was instructed on arch lifts and lunges  while maintaining an arch with resistance bands. He was given black theraband to perform these exercises at home.   Prior Level of Function: Running more than 1/2 mile, jumping without pain  Plan   Continue with arch control and hip stability.     Attempt gradual return to jogging when appropriate      Goals    Goal 1:  Patient will demonstrate independence in prescribed HEP with proper form, sets and reps for safe discharge to an independent program.   Sessions:  4      Goal 2:  Increase glut max and med strength to 5/5 to optimize running form/efficency.   Sessions:  8      Goal 3:  Patient will be able to run for more than 1 mile with no reports of LE pain.  Sessions:  8      Goal 4:  Increase Foto FS from 77 to 85.   Sessions:  8                              Name: David Macias  Referring Physician: Miles Costain, *  Diagnosis:     ICD-10-CM    1. Shin splints, subsequent encounter S86.899D         Precautions:   Date of Surgery:    MD Follow-up:           Exercise Flow Sheet    Exercise Specifics 09/09/17 09/11/17 09/16/17 09/21/17          TM    6'  RV  ET 3'  ck 5' ET  ak          3 way hip    BTB on airex pulses 2 way x20 ea  RV               S/S  GTB (feet) 2'  RV  BTB s/s+mw x2 laps  Ea  ck           SL abd    Glut arcs x20 ea  RV             bridges    SL bridge X10 RV               lunges  Kb #10 OH lunges x10 ea  RV   + black TB 3x15 each  ak          Arch lift Black TB    2x25 each  ak          Standing fire hydrant   Blue 3x15 each  ak              balance  Cone reach x10 ea  RV  Diagonal tiltboard taps   2x1' B  ck           Foam roller    3' ant tib/peroneals 2'  RV  GN/soleus stretch on half FR 2x30" ea  ck           clamshell     20x each  ak            SL heel raises   3x20 each  ak 2 up/1 dwn HR off step x20 B  ck           SL squat     2x10 each  ak 2x15  ck           Home Exercise Program                 (Initials = supervised exercise by clinician)    Raechel Chute, PT

## 2017-09-24 ENCOUNTER — Inpatient Hospital Stay: Payer: BC Managed Care – HMO | Attending: Family Medicine | Admitting: Rehabilitative and Restorative Service Providers"

## 2017-09-24 DIAGNOSIS — S86899D Other injury of other muscle(s) and tendon(s) at lower leg level, unspecified leg, subsequent encounter: Secondary | ICD-10-CM | POA: Insufficient documentation

## 2017-09-24 NOTE — PT/OT Therapy Note (Signed)
Name: David Macias Age: 26 y.o.   Referring Physician: Miles Costain, *   Date of Injury: 07/31/2017  Date Care Plan Established/Reviewed: 09/03/2017  Date Treatment Started: 09/03/2017  Visit Count: 6   Diagnosis:   1. Shin splints, subsequent encounter        Subjective     History of Present Illness   Functional Limitations (PLOF): Running more than 1/2 mile, jumping without pain    Pain   Current pain rating: 0  Location: bilateral shins    Social Support/Occupation      Occupation: desk job    [name] reports he felt fine after last session. He's been focusing more on his foot/arch position while walking.                       Treatment     Therapeutic Exercises   Justification: Warm up/stretches  [name]  Access Code: [email or web address]     See flowsheet    Neuromuscular Re-Education   Justification: To improve load tolerance in a neutral foot  DL squats  SL squat with externally rotated hips  Standing firehydrant     Manual Therapy   Justification:       Therapeutic Activity   Justification: To improve functional strength and to progress to PLOF of functional activities.     TM jog x 1 mile   -metronome set to 170bpm  -educated and instructed on a mid-forefoot strike  SL heel raises       ---      ---   Total Time   Timed Minutes  50 minutes   Total Time  50 minutes       Strength        Left Strength  Hip  MMT    Right    5 Hip Flexion  5    4+ Hip Extension  4+    4 Hip Abduction  4     Hip Adduction       Hip IR      5 Hip ER  4+    5 Quadriceps  5    5 Hamstrings  5   (blank fields were intentionally left blank)       Left Strength  Ankle/Foot  MMT    Right    5 Ankle Dorsiflexion  5     Ankle Plantarflexion      5 Ankle Inversion  5    5 Ankle Eversion  5   (blank fields were intentionally left blank)      Assessment   David Macias tolerated the session well. He was instructed on utilizing a metronome for his running. He was able to transition to a  mid-forefoot strike which alleviated his anterior shin splints immediately. He was able to jog slowly up to 1 mile without reports of increased pain. He has some hip weakness and will continue to benefit from glute strengthening.   Prior Level of Function: Running more than 1/2 mile, jumping without pain  Plan   Continue with arch control and hip stability.         Goals    Goal 1:  Patient will demonstrate independence in prescribed HEP with proper form, sets and reps for safe discharge to an independent program.   Sessions:  4      Goal 2:  Increase glut max and med strength to 5/5 to optimize running form/efficency.   Sessions:  8  Goal 3:  Patient will be able to run for more than 1 mile with no reports of LE pain.   Sessions:  8      Goal 4:  Increase Foto FS from 77 to 85.   Sessions:  8                              Name: David Macias  Referring Physician: Miles Costain, *  Diagnosis:     ICD-10-CM    1. Shin splints, subsequent encounter S86.899D         Precautions:   Date of Surgery:    MD Follow-up:           Exercise Flow Sheet    Exercise Specifics 09/09/17 09/11/17 09/16/17 09/21/17 09/24/17         TM    6'  RV  ET 3'  ck 5' ET  ak          3 way hip    BTB on airex pulses 2 way x20 ea  RV               S/S  GTB (feet) 2'  RV  BTB s/s+mw x2 laps  Ea  ck  Blue @ forefoot 3 laps  ak         SL abd    Glut arcs x20 ea  RV             bridges    SL bridge X10 RV               lunges  Kb #10 OH lunges x10 ea  RV   + black TB 3x15 each  ak          Arch lift Black TB    2x25 each  ak          Standing fire hydrant   Blue 3x15 each  ak              balance  Cone reach x10 ea  RV  Diagonal tiltboard taps   2x1' B  ck           Foam roller    3' ant tib/peroneals 2'  RV  GN/soleus stretch on half FR 2x30" ea  ck           clamshell     20x each  ak   Standing firehydrant 3x15 each  ak         SL heel raises   3x20 each  ak 2 up/1 dwn HR off step x20 B  ck  Off step         SL squat     2x10  each  ak 2x15  ck  DL 62Z  SL 30Q each  ak         Home Exercise Program                 (Initials = supervised exercise by clinician)    Raechel Chute, PT

## 2017-09-28 ENCOUNTER — Inpatient Hospital Stay: Payer: BC Managed Care – HMO | Attending: Family Medicine

## 2017-09-28 DIAGNOSIS — S86899D Other injury of other muscle(s) and tendon(s) at lower leg level, unspecified leg, subsequent encounter: Secondary | ICD-10-CM | POA: Insufficient documentation

## 2017-09-28 NOTE — Progress Notes (Signed)
Name: David Macias Age: 26 y.o.   Referring Physician: Miles Costain, *   Date of Injury: 07/31/2017  Date Care Plan Established/Reviewed: 09/28/2017  Date Treatment Started: 09/03/2017  Visit Count: 7   Diagnosis:   1. Shin splints, subsequent encounter        Subjective     History of Present Illness   Functional Limitations (PLOF): Running long distance    Social Support/Occupation      Occupation: desk job    Patient reports he has felt better when walking; no LE pain. Reports that he has not been able to run much due to travel. Wants to try running with metronome (it felt good during last session).        Strength     09/28/17 09/03/17  Left Strength  Hip  MMT 09/28/17 09/03/17  Right    5 Hip Flexion  5   4+ 4+ Hip Extension 4+ 4+   4+ 4 Hip Abduction 4 4     Hip Adduction       Hip IR     5 5 Hip ER 4+ 4+    5 Quadriceps  5    5 Hamstrings  5   (blank fields were intentionally left blank)       Left Strength  Ankle/Foot  MMT    Right    5 Ankle Dorsiflexion  5     Ankle Plantarflexion      5 Ankle Inversion  5    5 Ankle Eversion  5   (blank fields were intentionally left blank)           Treatment     Therapeutic Exercises   Justification: Create progress note and warm up  TM 5'  Obtained MMT    Neuromuscular Re-Education   Justification: Improve LE alignment and proprioception  Ball toss on Bosu 1'  Revese lunge with twist x10 ea  Side plank with abd 2 x30s    Therapeutic Activity   Justification: Improve preloading and landing mechanics for running  Plyometrics: squat jumps, split squats and box jumps x30s  Cued for form during plyometrics    Ladder drills 10'       ---      ---   Total Time   Timed Minutes  43 minutes   Total Time  43 minutes        Assessment   David Macias is a 26 y.o. male presenting with bilateral shin splints related to compensatory movement patterns who requires Physical Therapy for the following:  Impairments: hip weakness, R >L posterior  tibialis tenderness, eccentric deficits during plyometrics     Pain located: shin    Clinical presentation: stable     David Macias presents improvements in regards to diminish trigger points along posterior tibialis but patient needs to improve jumping mechanics which are important for running. Patient tends to be quad dominant during jumps which could lead to overstriding when running. Patient would benefit from additional sessions to address this.  Barriers to therapy: None  Prior Level of Function: Running long distance  Prognosis: good  Plan   Visits per week: 1  Number of Sessions: 4  Direct One on One  31517: Therapeutic Exercise: To Develop Strength and Endurance, ROM and Flexibility  [id number]: Neuromuscular Reeducation  97140: Manual Therapy techniques (mobilization, manipulation, manual traction)  97530: Therapeutic Activities: Dynamic activities to improve functional performance  Dry Needling  Supervised Modalities  97010: Thermal modalities: hot/cold packs  Goals    Goal 1:  Patient will demonstrate independence in prescribed HEP with proper form, sets and reps for safe discharge to an independent program.   Sessions:  4   Progression:  progressing      Goal 2:  Increase glut max and med strength to 5/5 to optimize running form/efficency.   Sessions:  13   Progression:  progressing       Goal 3:  Patient will be able to run for more than 1 mile with no reports of LE pain.   Sessions:  13   Progression:  progressing      Goal 4:  Increase Foto FS from 77 to 85.   Sessions:  8          Goal 5:  Improve preloading and landing mechanics during plyometrics to improve running endurance and power.   Sessions:  4                         Harlon Ditty, PT

## 2017-09-28 NOTE — PT/OT Therapy Note (Deleted)
Name: David Macias Age: 26 y.o.   Referring Physician: Miles Costain, *   Date of Injury: 07/31/2017  Date Care Plan Established/Reviewed: 09/03/2017  Date Treatment Started: 09/03/2017  Visit Count: 7   Diagnosis:   1. Shin splints, subsequent encounter        Subjective     History of Present Illness   Functional Limitations (PLOF): Running more than 1/2 mile, jumping without pain    Pain   Current pain rating: 0  Location: bilateral shins    Social Support/Occupation      Occupation: desk job    David Macias reports he has not been able to run much due to travel. No pain when walking.                      Treatment     Therapeutic Exercises   Justification: Warm up/stretches  [name]  Access Code: [email or web address]     See flowsheet  Obtained MMT    Neuromuscular Re-Education   Justification: To improve load tolerance in a neutral foot  Ball toss 1'    Manual Therapy   Justification:       Therapeutic Activity   Justification: Improve preloading and landing mechanics for running     Plyometrics  Agility drills        Strength     09/28/17 09/03/17  Left Strength  Hip  MMT 09/28/17 09/03/17  Right    5 Hip Flexion  5    4+ Hip Extension  4+    4 Hip Abduction  4     Hip Adduction       Hip IR      5 Hip ER  4+    5 Quadriceps  5    5 Hamstrings  5   (blank fields were intentionally left blank)       Left Strength  Ankle/Foot  MMT    Right    5 Ankle Dorsiflexion  5     Ankle Plantarflexion      5 Ankle Inversion  5    5 Ankle Eversion  5   (blank fields were intentionally left blank)      Assessment   David Macias tolerated the session well. He was instructed on utilizing a metronome for his running. He was able to transition to a mid-forefoot strike which alleviated his anterior shin splints immediately. He was able to jog slowly up to 1 mile without reports of increased pain. He has some hip weakness and will continue to benefit from glute strengthening.   Prior Level of  Function: Running more than 1/2 mile, jumping without pain  Plan   Continue with arch control and hip stability.         Goals    Goal 1:  Patient will demonstrate independence in prescribed HEP with proper form, sets and reps for safe discharge to an independent program.   Sessions:  4      Goal 2:  Increase glut max and med strength to 5/5 to optimize running form/efficency.   Sessions:  8      Goal 3:  Patient will be able to run for more than 1 mile with no reports of LE pain.   Sessions:  8      Goal 4:  Increase Foto FS from 77 to 85.   Sessions:  8  Name: David Macias  Referring Physician: Miles Costain, *  Diagnosis:     ICD-10-CM    1. Shin splints, subsequent encounter S86.899D         Precautions:   Date of Surgery:    MD Follow-up:           Exercise Flow Sheet    Exercise Specifics 09/09/17 09/11/17 09/16/17 09/21/17 09/24/17 09/28/17        TM    6'  RV  ET 3'  ck 5' ET  ak  5' TM  RV        3 way hip    BTB on airex pulses 2 way x20 ea  RV               S/S  GTB (feet) 2'  RV  BTB s/s+mw x2 laps  Ea  ck  Blue @ forefoot 3 laps  ak         SL abd    Glut arcs x20 ea  RV             bridges    SL bridge X10 RV               lunges  Kb #10 OH lunges x10 ea  RV   + black TB 3x15 each  ak          Arch lift Black TB    2x25 each  ak          Standing fire hydrant   Blue 3x15 each  ak              balance  Cone reach x10 ea  RV  Diagonal tiltboard taps   2x1' B  ck           Foam roller    3' ant tib/peroneals 2'  RV  GN/soleus stretch on half FR 2x30" ea  ck           clamshell     20x each  ak   Standing firehydrant 3x15 each  ak         SL heel raises   3x20 each  ak 2 up/1 dwn HR off step x20 B  ck  Off step         SL squat     2x10 each  ak 2x15  ck  DL 21H  SL 08M each  ak         Home Exercise Program                 (Initials = supervised exercise by clinician)    Harlon Ditty, PT

## 2017-10-02 ENCOUNTER — Inpatient Hospital Stay: Payer: BC Managed Care – PPO | Admitting: Rehabilitative and Restorative Service Providers"

## 2017-10-05 ENCOUNTER — Inpatient Hospital Stay: Payer: BC Managed Care – HMO | Attending: Family Medicine | Admitting: Rehabilitative and Restorative Service Providers"

## 2017-10-05 DIAGNOSIS — S86899D Other injury of other muscle(s) and tendon(s) at lower leg level, unspecified leg, subsequent encounter: Secondary | ICD-10-CM | POA: Insufficient documentation

## 2017-10-05 NOTE — PT/OT Therapy Note (Signed)
Name: David Macias Age: 26 y.o.   Referring Physician: Miles Costain, *   Date of Injury: 07/31/2017  Date Care Plan Established/Reviewed: 09/28/2017  Date Treatment Started: 09/03/2017  Visit Count: 8   Diagnosis:   1. Shin splints, subsequent encounter        Subjective     History of Present Illness   Functional Limitations (PLOF): Running more than 1/2 mile, jumping without pain    Improvements: run/walk up to 2 miles    Pain   Current pain rating: 0  Location: bilateral shins    Social Support/Occupation      Occupation: desk job    David Macias reports he's actually doing pretty good. He has been running about 2 miles every other day which is a "monumental" change.                       Treatment     Therapeutic Exercises   Justification: Warm up/stretches  [name]  Access Code: [email or web address]     See flowsheet    Manual Therapy   Justification:       Therapeutic Activity   Justification: To improve functional strength and to progress to PLOF of functional activities.     TM jog x 1 mile   -metronome set to 180bpm  -cued on mid-forefoot strike  -cued on pushing off the great toe    DL squat jumps 1Y78  DL broad jump 2N56  SL hops med/lat/fwd/bkwd pool noodle          ---      ---   Total Time   Timed Minutes  45 minutes   Total Time  45 minutes       Strength        Left Strength  Hip  MMT    Right    5 Hip Flexion  5    4+ Hip Extension  4+    4 Hip Abduction  4     Hip Adduction       Hip IR      5 Hip ER  4+    5 Quadriceps  5    5 Hamstrings  5   (blank fields were intentionally left blank)       Left Strength  Ankle/Foot  MMT    Right    5 Ankle Dorsiflexion  5     Ankle Plantarflexion      5 Ankle Inversion  5    5 Ankle Eversion  5   (blank fields were intentionally left blank)      Assessment   [name] tolerated the session well. He has improved his running capacity and is progressing. His running was re-assessed today as he ran for 1 mile. He was  cued on pushing off the great toe to allow for natural pronation to occur. He was progressed with jumping and restricted by his load capacity. He was instructed on increasing his work load as tolerated.   Prior Level of Function: Running more than 1/2 mile, jumping without pain    Improvements: run/walk up to 2 miles  Plan   Continue with arch control and hip stability.         Goals    Goal 1:  Patient will demonstrate independence in prescribed HEP with proper form, sets and reps for safe discharge to an independent program.   Sessions:  4   Progression:  progressing      Goal 2:  Increase glut max and med strength to 5/5 to optimize running form/efficency.   Sessions:  13   Progression:  progressing       Goal 3:  Patient will be able to run for more than 1 mile with no reports of LE pain.   Sessions:  13   Progression:  progressing      Goal 4:  Increase Foto FS from 77 to 85.   Sessions:  8          Goal 5:  Improve preloading and landing mechanics during plyometrics to improve running endurance and power.   Sessions:  4                       Name: David Macias  Referring Physician: Miles Costain, *  Diagnosis:     ICD-10-CM    1. Shin splints, subsequent encounter S86.899D         Precautions:   Date of Surgery:    MD Follow-up:           Exercise Flow Sheet    Exercise Specifics 09/09/17 09/11/17 09/16/17 09/21/17 09/24/17 10/05/17        TM    6'  RV  ET 3'  ck 5' ET  ak  1 mile  ak        3 way hip    BTB on airex pulses 2 way x20 ea  RV               S/S  GTB (feet) 2'  RV  BTB s/s+mw x2 laps  Ea  ck  Blue @ forefoot 3 laps  ak         SL abd    Glut arcs x20 ea  RV             bridges    SL bridge X10 RV               lunges  Kb #10 OH lunges x10 ea  RV   + black TB 3x15 each  ak          Arch lift Black TB    2x25 each  ak          Standing fire hydrant   Blue 3x15 each  ak              balance  Cone reach x10 ea  RV  Diagonal tiltboard taps   2x1' B  ck           Foam roller    3' ant  tib/peroneals 2'  RV  GN/soleus stretch on half FR 2x30" ea  ck           clamshell     20x each  ak   Standing firehydrant 3x15 each  ak         SL heel raises   3x20 each  ak 2 up/1 dwn HR off step x20 B  ck  Off step         SL squat     2x10 each  ak 2x15  ck  DL 02R  SL 42H each  ak         Home Exercise Program                 (Initials = supervised exercise by clinician)    Raechel Chute, PT

## 2017-10-12 ENCOUNTER — Inpatient Hospital Stay: Payer: BC Managed Care – HMO | Attending: Family Medicine | Admitting: Rehabilitative and Restorative Service Providers"

## 2017-10-12 DIAGNOSIS — S86899D Other injury of other muscle(s) and tendon(s) at lower leg level, unspecified leg, subsequent encounter: Secondary | ICD-10-CM

## 2017-10-12 NOTE — PT/OT Therapy Note (Signed)
Name: David Macias Age: 26 y.o.   Referring Physician: Miles Costain, *   Date of Injury: 07/31/2017  Date Care Plan Established/Reviewed: 09/28/2017  Date Treatment Started: 09/03/2017  Visit Count: 9   Diagnosis:   1. Shin splints, subsequent encounter        Subjective     History of Present Illness   Functional Limitations (PLOF): Jumping without pain    Improvements: run/walk up to 2 miles    Pain   Current pain rating: 0  Location: bilateral shins    Social Support/Occupation      Occupation: desk job    David Macias reports the shins are doing well. He's still getting twinges on occasion.                       Treatment     Therapeutic Exercises   Justification: Warm up/stretches  [name]  Access Code: [email or web address]     See flowsheet    Neuromuscular Re-Education   Justification: Cues during sidelying exercises for neutral pelvis  clamshells    Manual Therapy   Justification:          ---      ---   Total Time   Timed Minutes  45 minutes   Total Time  45 minutes       Strength        Left Strength  Hip  MMT    Right    5 Hip Flexion  5    4+ Hip Extension  4+    4 Hip Abduction  4     Hip Adduction       Hip IR      5 Hip ER  4+    5 Quadriceps  5    5 Hamstrings  5   (blank fields were intentionally left blank)       Left Strength  Ankle/Foot  MMT    Right    5 Ankle Dorsiflexion  5     Ankle Plantarflexion      5 Ankle Inversion  5    5 Ankle Eversion  5   (blank fields were intentionally left blank)      Assessment   David Macias tolerated the session well. He was progressed from SL squats to SL squat jumps. He required minimal cues during clamshell to maintain a neutral pelvis. He is progressing. He didn't have any pain today with jumping. He was advised to attempt playing basketball between next session to identify further impairments to address during therapy.   Prior Level of Function: Jumping without pain    Improvements: run/walk up to 2 miles  Plan    Assess patient progress and determine discharge status.       Goals    Goal 1:  Patient will demonstrate independence in prescribed HEP with proper form, sets and reps for safe discharge to an independent program.   Sessions:  4   Progression:  progressing      Goal 2:  Increase glut max and med strength to 5/5 to optimize running form/efficency.   Sessions:  13   Progression:  progressing       Goal 3:  Patient will be able to run for more than 1 mile with no reports of LE pain.   Sessions:  13   Progression:  progressing      Goal 4:  Increase Foto FS from 77 to 85.   Sessions:  8  Goal 5:  Improve preloading and landing mechanics during plyometrics to improve running endurance and power.   Sessions:  4                       Name: [name]  Referring Physician: Miles Costain, *  Diagnosis:     ICD-10-CM    1. Shin splints, subsequent encounter S86.899D         Precautions:   Date of Surgery:    MD Follow-up:           Exercise Flow Sheet    Exercise Specifics 09/11/17 09/16/17 09/21/17 09/24/17 10/05/17 10/12/17       TM     ET 3'  ck 5' ET  ak  1 mile  ak        3 way hip                  S/S   BTB s/s+mw x2 laps  Ea  ck  Blue @ forefoot 3 laps  ak         SL deadlift         #20 2x10 each  ak       bridges                  lunges    + black TB 3x15 each  ak          Arch lift Black TB   2x25 each  ak          Standing fire hydrant  Blue 3x15 each  ak              balance   Diagonal tiltboard taps   2x1' B  ck           Foam roller     GN/soleus stretch on half FR 2x30" ea  ck           clamshell    20x each  ak   Standing firehydrant 3x15 each  ak  3x25 each  ak       SL heel raises  3x20 each  ak 2 up/1 dwn HR off step x20 B  ck  Off step         SL squat    2x10 each  ak 2x15  ck  DL 38V  SL 56E each  ak  SL 2x20 each  ak       Home Exercise Program                (Initials = supervised exercise by clinician)    Raechel Chute, PT

## 2017-10-19 ENCOUNTER — Inpatient Hospital Stay: Payer: BC Managed Care – HMO | Admitting: Rehabilitative and Restorative Service Providers"

## 2017-10-26 ENCOUNTER — Inpatient Hospital Stay: Payer: BC Managed Care – HMO | Admitting: Rehabilitative and Restorative Service Providers"

## 2020-10-17 ENCOUNTER — Ambulatory Visit (INDEPENDENT_AMBULATORY_CARE_PROVIDER_SITE_OTHER): Payer: Commercial Managed Care - POS

## 2020-10-17 ENCOUNTER — Encounter (INDEPENDENT_AMBULATORY_CARE_PROVIDER_SITE_OTHER): Payer: Self-pay | Admitting: Internal Medicine

## 2020-10-17 ENCOUNTER — Telehealth (INDEPENDENT_AMBULATORY_CARE_PROVIDER_SITE_OTHER): Payer: Commercial Managed Care - POS | Admitting: Internal Medicine

## 2020-10-17 DIAGNOSIS — L209 Atopic dermatitis, unspecified: Secondary | ICD-10-CM | POA: Insufficient documentation

## 2020-10-17 DIAGNOSIS — K581 Irritable bowel syndrome with constipation: Secondary | ICD-10-CM | POA: Insufficient documentation

## 2020-10-17 DIAGNOSIS — Z91018 Allergy to other foods: Secondary | ICD-10-CM | POA: Insufficient documentation

## 2020-10-17 DIAGNOSIS — Z872 Personal history of diseases of the skin and subcutaneous tissue: Secondary | ICD-10-CM | POA: Insufficient documentation

## 2020-10-17 DIAGNOSIS — Z87448 Personal history of other diseases of urinary system: Secondary | ICD-10-CM | POA: Insufficient documentation

## 2020-10-17 DIAGNOSIS — R509 Fever, unspecified: Secondary | ICD-10-CM

## 2020-10-17 DIAGNOSIS — R6889 Other general symptoms and signs: Secondary | ICD-10-CM

## 2020-10-17 DIAGNOSIS — R059 Cough, unspecified: Secondary | ICD-10-CM

## 2020-10-17 LAB — POCT INFLUENZA A/B
POCT Rapid Influenza A AG: POSITIVE — AB
POCT Rapid Influenza B AG: NEGATIVE

## 2020-10-17 MED ORDER — OSELTAMIVIR PHOSPHATE 75 MG PO CAPS
75.0000 mg | ORAL_CAPSULE | Freq: Two times a day (BID) | ORAL | 0 refills | Status: AC
Start: 2020-10-17 — End: 2020-10-22

## 2020-10-17 NOTE — Progress Notes (Signed)
[facility] INTERNAL MEDICINE - [facility] [place] [facility]                 Date of Virtual Visit: 10/17/2020 10:50 AM        Patient ID: David Macias is a 29 y.o. male.  Attending Physician: Kaleen Odea, MD       Telemedicine Eligibility:    State Location:  [x]  [place]  []  [place]  []  [place] []  [place]  []  Other:    Physical Location:  [x]  Home  []  Work    []  Other:    Patient Identity Verification:  [x]  State Issued ID  []  Insurance Eligibility Check  []  Other:    Physical Address Verification: (for 911)  [x]  Yes  []  No    Personal identity shared with patient:  [x]  Yes  []  No    Education on nature of video visit shared with patient:  [x]  Yes  []  No    Verbal consent has been obtained from the patient to conduct a video and telephone visit to minimize exposure to COVID-19:   [x]  Yes  []  No    Language:  English . Translator was required:   [x]  Yes  []  No    Visit terminated since not appropriate for virtual care:  [x]  N/A  []  Reason:               Chief Complaint:    Chief Complaint   Patient presents with    Fever             HPI:    HPI      COVID-19 Questionnaire:  Last updated: Apr 14, 2019    Fever: yes  Cough: yes  Dyspnea: no  Additional Symptoms: shaking chills, headache, fatigue, sore throat and nausea  Close contact with a laboratory confirmed COVID-19 person: no  Therapist, occupational:  no  Immunocompromised:  no immunocompromising conditions  Long term facility or nursing home resident: No  Sole provider for dependent at high risk:  no  Pregnant 3rd trimester or High Risk Pregnancy: N/A      Complaining of fever for last 2 days associated with chills, headache and sore throat.  He was tested negative for COVID-19 2 days ago, no known exposure to any illness.  He did travel to [address] over the weekend the symptoms started afterwards.  He has been taking Tylenol for fever.         Problem List:    Patient Active Problem List   Diagnosis     Allergy to tree nuts    Atopic dermatitis, unspecified type    History of hematuria    History of pilonidal cyst    Irritable bowel syndrome with constipation             Current Meds:    No outpatient medications have been marked as taking for the 10/17/20 encounter (Telemedicine Visit) with Kaleen Odea, MD.          Allergies:    Allergies   Allergen Reactions    Peanut Oil Anaphylaxis     Tree nuts    Tree Nuts Anaphylaxis             Past Surgical History:    Past Surgical History:   Procedure Laterality Date    CYSTECTOMY, PILONIDAL  11/07/2016    FOOT SURGERY Left 2013    ankle tendon  Family History:    Family History   Problem Relation Age of Onset    Thyroid cancer Mother     Hyperlipidemia Father     Hypertension Father     Hyperlipidemia Brother     Thyroid disease Maternal Aunt     Thyroid disease Maternal Grandmother     Colon polyps Maternal Grandfather     Heart disease Paternal Grandfather            Social History:    Social History     Socioeconomic History    Marital status: Unknown     Spouse name: None    Number of children: None    Years of education: None    Highest education level: None   Occupational History    None   Tobacco Use    Smoking status: Never Smoker    Smokeless tobacco: Never Used   Substance and Sexual Activity    Alcohol use: Yes     Alcohol/week: 3.0 - 4.0 standard drinks     Types: 3 - 4 Standard drinks or equivalent per week    Drug use: Never    Sexual activity: None   Other Topics Concern    None   Social History Narrative    Nutrition:  Well balanced diet    Caffeine: Occ.    Seat  Belt use:  Yes    Smoke Dectector in the home:  Yes    Pets:  none                     Social Determinants of Psychologist, prison and probation services Strain:     Difficulty of Paying Living Expenses: Not on file   Food Insecurity:     Worried About Programme researcher, broadcasting/film/video in the Last Year: Not on file    [employer] of Food in the Last Year: Not on file    Transportation Needs:     Lack of Transportation (Medical): Not on file    Lack of Transportation (Non-Medical): Not on file   Physical Activity:     Days of Exercise per Week: Not on file    Minutes of Exercise per Session: Not on file   Stress:     Feeling of Stress : Not on file   Social Connections:     Frequency of Communication with Friends and Family: Not on file    Frequency of Social Gatherings with Friends and Family: Not on file    Attends Religious Services: Not on file    Active Member of Clubs or Organizations: Not on file    Attends Banker Meetings: Not on file    Marital Status: Not on file   Intimate Partner Violence:     Fear of Current or Ex-Partner: Not on file    Emotionally Abused: Not on file    Physically Abused: Not on file    Sexually Abused: Not on file   Housing Stability:     Unable to Pay for Housing in the Last Year: Not on file    Number of Places Lived in the Last Year: Not on file    Unstable Housing in the Last Year: Not on file           The following sections were reviewed this encounter by the provider:   Tobacco   Allergies   Meds   Problems   Med Hx   Surg Hx  Fam Hx   Soc Hx              Vital Signs:    There were no vitals taken for this visit.         ROS:    Review of Systems     General feeling fair, medications reviewed,  medication side effects absent.  Skin no rash present  HEENT no change in vision  Respiratory as in HPI  Cardiovascular chest pain or palpitation not present  Gastrointestinal abdomen pain not present  Musculoskeletal Muscle weakness  not present  Neurological dizziness and headaches not present  Hematology abnormal bleeding not present            Physical Exam:    Physical Exam   General Mental status alert, appearance fair, not in any acute distress, well nourished and developed with normal posture.  Skin no rashes  Eye  Sclera conjunctiva- bilateral normal  ENMT   normal ear appearance and hearing,normal appearance  of nose,ears and lips including face.    Chest and lung exam  Normal respiratory effort with no use of accessory muscles noted, no audible wheeze noted     Limbs able to move all limbs    Neuropsychiatric  Mental status exam alert oriented x 3 with appropriate mood and affect.          Assessment/Plan:        Problem List Items Addressed This Visit     None      Visit Diagnoses     Flu-like symptoms    -  Primary    Relevant Medications    oseltamivir (TAMIFLU) 75 MG capsule    Other Relevant Orders    POCT Influenza A/B      Flulike illness presentation most likely to be influenza, will do point-of-care test today and start on Tamiflu.  Benefit and side effects explained.             Plan    Monitor your body temperature twice per day  Take Tylenol 650 mg or Motrin 400 mg every 6 hour as needed for pain or temperature more than 100.4.  Stay hydrated at home.  Self isolate until fever resolves for 72 hours without medication, for a total of at least 7 days from the onset of your symptoms.  Monitor finger pulse oxygen saturation if possible at home, call ASAP or go to the ER for consistent readings below 90%.  Call for any new chest pain, tightness, worsening of cough or shortness of breath.  Advised to go to emergency room for any new chest pain associated with shortness of breath.    Keep your regular follow-up  once recovered.     Follow-up:    Return if symptoms worsen or fail to improve.         Karisha Marlin P Elmar Antigua, MD    This visit was completed virtually due to the restrictions of the COVID-19 pandemic. All issues above were discussed and addressed but no physical exam was performed unless allowed by visual confirmation.  If it was assessed that  the patients symptoms warranted an office visit, the patient was advised and offered an appointment in the office.        Time spent on telemedicine visit:15

## 2020-10-17 NOTE — Patient Instructions (Signed)
Monitor your body temperature twice per day  Take Tylenol 650 mg or Motrin 400 mg every 6 hour as needed for pain or temperature more than 100.4.  Stay hydrated at home.    Self isolate until fever (if present) resolves for 72 hours without medication, for a total of at least 7-10 days from the onset of your symptoms, or at least 10 days from known exposure if asymptomatic, or 7 days from exposure if asymptomatic and tested negative, or at least 10 days from a positive test.    Monitor finger pulse oxygen saturation if possible at home, call ASAP or go to the ER for consistent readings below 90%.    Call for any new chest pain, tightness, worsening of cough or shortness of breath. You can  be seen in respiratory clinic for any change at [facility]  daily 8 AM to 8 PM walk in with no appointment needed at  [address], [place].      Advised to go to nearest emergency room for any new chest pain associated with shortness of breath.      Keep your regular follow-up  once recovered.

## 2020-10-17 NOTE — Progress Notes (Signed)
The influenza result is positive , please continue the medication as prescribed and follow the treatment plan as advised. A vm left for the patient on his mobile number.      Fabio Neighbors MD    [phone] [address]. [address]  [address]  [place],  [place] [address]  Telephone [phone]

## 2020-10-19 ENCOUNTER — Telehealth (INDEPENDENT_AMBULATORY_CARE_PROVIDER_SITE_OTHER): Payer: Self-pay | Admitting: Internal Medicine

## 2020-10-19 NOTE — Telephone Encounter (Signed)
Dr. Clovia Cuff    The pt called to see when he will no longer be contagious from the flu. He did mention that his fever has broken since 10/17/20. He can be reached on [phone]

## 2020-10-19 NOTE — Telephone Encounter (Signed)
Spoke with the patient he has taken 6 pills of tamiflu , fever has resolved and he is feeling much better.  He is no longer considered infectious , he should finish the course , still need to observe standard hand washing and mask precautions.      Fabio Neighbors MD    [phone] [address]  [address]  [place],  [place] [address]  Telephone [phone]

## 2020-12-20 ENCOUNTER — Encounter (INDEPENDENT_AMBULATORY_CARE_PROVIDER_SITE_OTHER): Payer: Self-pay | Admitting: Internal Medicine

## 2020-12-20 ENCOUNTER — Ambulatory Visit (INDEPENDENT_AMBULATORY_CARE_PROVIDER_SITE_OTHER): Payer: Commercial Managed Care - POS | Admitting: Internal Medicine

## 2020-12-20 VITALS — BP 110/72 | HR 67 | Temp 97.6°F | Resp 18 | Ht 69.75 in | Wt 182.0 lb

## 2020-12-20 DIAGNOSIS — Z23 Encounter for immunization: Secondary | ICD-10-CM

## 2020-12-20 DIAGNOSIS — Z91018 Allergy to other foods: Secondary | ICD-10-CM

## 2020-12-20 DIAGNOSIS — R079 Chest pain, unspecified: Secondary | ICD-10-CM

## 2020-12-20 DIAGNOSIS — L409 Psoriasis, unspecified: Secondary | ICD-10-CM

## 2020-12-20 DIAGNOSIS — Z0000 Encounter for general adult medical examination without abnormal findings: Secondary | ICD-10-CM

## 2020-12-20 DIAGNOSIS — Z87448 Personal history of other diseases of urinary system: Secondary | ICD-10-CM

## 2020-12-20 MED ORDER — EPIPEN 2-PAK 0.3 MG/0.3ML IJ SOAJ: 0.3000 mg | Freq: Once | INTRAMUSCULAR | 0 refills | Status: DC

## 2020-12-20 MED ORDER — PANTOPRAZOLE SODIUM 40 MG PO TBEC
40.0000 mg | DELAYED_RELEASE_TABLET | Freq: Every day | ORAL | 0 refills | Status: DC
Start: 2020-12-20 — End: 2021-01-18

## 2020-12-20 NOTE — Progress Notes (Signed)
[facility] INTERNAL MEDICINE - AN [place] PARTNER                  Date of Exam: 12/20/2020 7:37 PM        Patient ID: David Macias is a 30 y.o. male.        Chief Complaint:    Chief Complaint   Patient presents with    Annual Exam     2 frackle; one on each eye. 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 He has oncology follow up tomorrow referred by optho    He reports some sharp episodic chest pain, onset after meals. Not with exercise. No SOB. No reproduction on palpation.   Unremarkable EKG today  Trial Protonix x 14 days for likely GI cause.          Problem List:    Patient Active Problem List   Diagnosis    Allergy to tree nuts    Atopic dermatitis, unspecified type    History of hematuria    History of pilonidal cyst    Irritable bowel syndrome with constipation    Psoriasis             Current Meds:    Outpatient Medications Marked as Taking for the 12/20/20 encounter (Office Visit) with Sara Chu, MD   Medication Sig Dispense Refill    Calcipotriene-Betameth Diprop (Enstilar) 0.005-0.064 % Foam prn (0.005-0.064 %)      clobetasol (TEMOVATE) 0.05 % external solution clobetasol 0.05 % scalp solution   APPLY TO SCALP AT BEDTIME X 180 DAYS      EPINEPHrine (EPIPEN 2-PAK) 0.3 MG/0.3ML Solution Auto-injector injection Inject 0.3 mLs (0.3 mg total) into the skin once for 1 dose Prn allergic reaction 1 each 0    [DISCONTINUED] EPINEPHrine (EPIPEN 2-PAK) 0.3  MG/0.3ML Solution Auto-injector injection Inject 0.3 mg into the skin Prn allergic reaction            Allergies:    Allergies   Allergen Reactions    Peanut Oil Anaphylaxis     Tree nuts    Tree Nuts Anaphylaxis             Past Surgical History:    Past Surgical History:   Procedure Laterality Date    CYSTECTOMY, PILONIDAL  11/07/2016    FOOT SURGERY Left 2013    ankle tendon           Family History:    Family History   Problem Relation Age of Onset    Thyroid cancer Mother     Hyperlipidemia Father     Hypertension Father     Hyperlipidemia Brother     Thyroid disease Maternal Aunt     Thyroid disease Maternal Grandmother     Colon polyps Maternal Grandfather     Heart disease Paternal Grandfather  Social History:    Social History     Socioeconomic History    Marital status: Unknown     Spouse name: None    Number of children: None    Years of education: None    Highest education level: None   Occupational History    None   Tobacco Use    Smoking status: Never Smoker    Smokeless tobacco: Never Used   Vaping Use    Vaping Use: Never used   Substance and Sexual Activity    Alcohol use: Yes     Alcohol/week: 3.0 - 4.0 standard drinks     Types: 3 - 4 Standard drinks or equivalent per week     Comment: 3-4 drinks per week    Drug use: Never    Sexual activity: Yes     Partners: Female     Birth control/protection: I.U.D.   Other Topics Concern    None   Social History Narrative    Nutrition:  Well balanced diet    Caffeine: Occ.    Seat  Belt use:  Yes    Smoke Dectector in the home:  Yes    Pets:  none                     Social Determinants of Psychologist, prison and probation services Strain:     Difficulty of Paying Living Expenses: Not on file   Food Insecurity:     Worried About Programme researcher, broadcasting/film/video in the Last Year: Not on file    [employer] of Food in the Last Year: Not on file   Transportation Needs:     Lack of Transportation (Medical): Not on file    Lack of Transportation  (Non-Medical): Not on file   Physical Activity:     Days of Exercise per Week: Not on file    Minutes of Exercise per Session: Not on file   Stress:     Feeling of Stress : Not on file   Social Connections:     Frequency of Communication with Friends and Family: Not on file    Frequency of Social Gatherings with Friends and Family: Not on file    Attends Religious Services: Not on file    Active Member of Clubs or Organizations: Not on file    Attends Banker Meetings: Not on file    Marital Status: Not on file   Intimate Partner Violence:     Fear of Current or Ex-Partner: Not on file    Emotionally Abused: Not on file    Physically Abused: Not on file    Sexually Abused: Not on file   Housing Stability:     Unable to Pay for Housing in the Last Year: Not on file    Number of Places Lived in the Last Year: Not on file    Unstable Housing in the Last Year: Not on file           The following sections were reviewed this encounter by the provider:   Tobacco   Allergies   Meds   Problems   Med Hx   Surg Hx   Fam Hx              Vital Signs:    Vitals:    12/20/20 1322   BP: 110/72   BP Site: Left arm   Patient Position: Sitting   Cuff Size: Medium  Pulse: 67   Resp: 18   Temp: 97.6 F (36.4 C)   TempSrc: Temporal   SpO2: 98%   Weight: 82.6 kg (182 lb)   Height: 1.772 m (5' 9.75")            ROS:    Review of Systems   Constitutional: Negative for chills and fever.   HENT: Negative for hearing loss and tinnitus.    Eyes: Negative for pain and visual disturbance.   Respiratory: Negative for cough and shortness of breath.    Cardiovascular: Positive for chest pain. Negative for palpitations.   Gastrointestinal: Negative for constipation, diarrhea, nausea and vomiting.   Genitourinary: Negative for difficulty urinating and dysuria.   Musculoskeletal: Negative for arthralgias and myalgias.   Skin: Positive for rash. Negative for wound.   Neurological: Negative for weakness and numbness.    Psychiatric/Behavioral: Negative for agitation and confusion.   All other systems reviewed and are negative.             Physical Exam:    Physical Exam  Vitals reviewed.   Constitutional:       Appearance: Normal appearance.   HENT:      Head: Normocephalic and atraumatic.      Right Ear: Tympanic membrane, ear canal and external ear normal.      Left Ear: Tympanic membrane, ear canal and external ear normal.      Nose: Nose normal.      Mouth/Throat:      Mouth: Mucous membranes are moist.   Eyes:      Extraocular Movements: Extraocular movements intact.      Pupils: Pupils are equal, round, and reactive to light.   Cardiovascular:      Rate and Rhythm: Normal rate and regular rhythm.      Pulses: Normal pulses.   Pulmonary:      Effort: Pulmonary effort is normal.      Breath sounds: Normal breath sounds.   Abdominal:      General: There is no distension.      Palpations: Abdomen is soft.      Tenderness: There is no abdominal tenderness. There is no guarding or rebound.   Genitourinary:     Penis: Normal.       Testes: Normal.   Musculoskeletal:         General: Normal range of motion.      Cervical back: Normal range of motion and neck supple. No tenderness.      Right lower leg: No edema.      Left lower leg: No edema.   Lymphadenopathy:      Cervical: No cervical adenopathy.   Skin:     General: Skin is warm and dry.   Neurological:      General: No focal deficit present.      Mental Status: He is alert and oriented to person, place, and time. Mental status is at baseline.   Psychiatric:         Mood and Affect: Mood normal.         Behavior: Behavior normal.         Thought Content: Thought content normal.         Judgment: Judgment normal.              Assessment/Plan:    Health Maintenance:  Recommend optimizing low carbohydrate diet efforts and obtaining at least 150 minutes of aerobic exercise per week. Recommend 20-25 grams of dietary fiber daily.  Recommend drinking at least 60-80 ounces of water per  day.  Vision screening UTD. Dental Screening UTD. Influenza vaccination declined.   Problem List Items Addressed This Visit        Musculoskeletal and Integument    Psoriasis  - well controlled, followed by derm       Other    Allergy to tree nuts  Refill epipen    Relevant Medications    EPINEPHrine (EPIPEN 2-PAK) 0.3 MG/0.3ML Solution Auto-injector injection    History of hematuria  Recheck today on request    Relevant Orders    Urinalysis Reflex to Microscopic Exam- Reflex to Culture      Other Visit Diagnoses     Health maintenance examination    -  Primary    Relevant Orders    CBC and differential    Comprehensive metabolic panel    Lipid panel    Need for vaccination        Relevant Orders    Flu vaccine QUADRIVALENT (PF) 6 months and older (FLULAVAL/FLUARIX)    Chest pain, unspecified type      - suspect GI  - unremarkable EKG today  - no reproduction on palpation  - no SOB  - ED precautions reviewed  - start Protonix x 14 days    Relevant Medications    pantoprazole (Protonix) 40 MG tablet    Other Relevant Orders    ECG 12 lead                     Follow-up:    Return in about 1 year (around 12/20/2021), or if symptoms worsen or fail to improve, for CPX.         Sara Chu, MD

## 2020-12-21 ENCOUNTER — Other Ambulatory Visit (FREE_STANDING_LABORATORY_FACILITY): Payer: Commercial Managed Care - POS

## 2020-12-21 ENCOUNTER — Ambulatory Visit (INDEPENDENT_AMBULATORY_CARE_PROVIDER_SITE_OTHER): Payer: Commercial Managed Care - POS

## 2020-12-21 DIAGNOSIS — Z87448 Personal history of other diseases of urinary system: Secondary | ICD-10-CM

## 2020-12-21 DIAGNOSIS — Z0000 Encounter for general adult medical examination without abnormal findings: Secondary | ICD-10-CM

## 2020-12-21 LAB — COMPREHENSIVE METABOLIC PANEL
ALT: 33 U/L (ref 0–55)
AST (SGOT): 23 U/L (ref 5–34)
Albumin/Globulin Ratio: 1.8 (ref 0.9–2.2)
Albumin: 4.6 g/dL (ref 3.5–5.0)
Alkaline Phosphatase: 52 U/L (ref 37–117)
Anion Gap: 3 — ABNORMAL LOW (ref 5.0–15.0)
BUN: 13 mg/dL (ref 9.0–28.0)
Bilirubin, Total: 1.3 mg/dL — ABNORMAL HIGH (ref 0.2–1.2)
CO2: 30 mEq/L — ABNORMAL HIGH (ref 21–29)
Calcium: 10.4 mg/dL (ref 8.5–10.5)
Chloride: 103 mEq/L (ref 100–111)
Creatinine: 1 mg/dL (ref 0.5–1.5)
Globulin: 2.6 g/dL (ref 2.0–3.7)
Glucose: 85 mg/dL (ref 70–100)
Potassium: 4.2 mEq/L (ref 3.5–5.1)
Protein, Total: 7.2 g/dL (ref 6.0–8.3)
Sodium: 136 mEq/L (ref 136–145)

## 2020-12-21 LAB — CBC AND DIFFERENTIAL
Absolute NRBC: 0 10*3/uL (ref 0.00–0.00)
Basophils Absolute Automated: 0.03 10*3/uL (ref 0.00–0.08)
Basophils Automated: 0.5 %
Eosinophils Absolute Automated: 0.39 10*3/uL (ref 0.00–0.44)
Eosinophils Automated: 6.5 %
Hematocrit: 43.6 % (ref 37.6–49.6)
Hgb: 14.7 g/dL (ref 12.5–17.1)
Immature Granulocytes Absolute: 0.01 10*3/uL (ref 0.00–0.07)
Immature Granulocytes: 0.2 %
Lymphocytes Absolute Automated: 2.12 10*3/uL (ref 0.42–3.22)
Lymphocytes Automated: 35.6 %
MCH: 30.3 pg (ref 25.1–33.5)
MCHC: 33.7 g/dL (ref 31.5–35.8)
MCV: 89.9 fL (ref 78.0–96.0)
MPV: 10.9 fL (ref 8.9–12.5)
Monocytes Absolute Automated: 0.38 10*3/uL (ref 0.21–0.85)
Monocytes: 6.4 %
Neutrophils Absolute: 3.03 10*3/uL (ref 1.10–6.33)
Neutrophils: 50.8 %
Nucleated RBC: 0 /100 WBC (ref 0.0–0.0)
Platelets: 265 10*3/uL (ref 142–346)
RBC: 4.85 10*6/uL (ref 4.20–5.90)
RDW: 13 % (ref 11–15)
WBC: 5.96 10*3/uL (ref 3.10–9.50)

## 2020-12-21 LAB — URINALYSIS REFLEX TO MICROSCOPIC EXAM - REFLEX TO CULTURE
Bilirubin, UA: NEGATIVE
Blood, UA: NEGATIVE
Glucose, UA: NEGATIVE
Ketones UA: NEGATIVE
Leukocyte Esterase, UA: NEGATIVE
Nitrite, UA: NEGATIVE
Protein, UR: NEGATIVE
Specific Gravity UA: 1.008 (ref 1.001–1.035)
Urine Bacteria: NONE SEEN /hpf
Urine pH: 6.5 (ref 5.0–8.0)
Urobilinogen, UA: 0.2 (ref 0.2–2.0)

## 2020-12-21 LAB — LIPID PANEL
Cholesterol / HDL Ratio: 4.8
Cholesterol: 181 mg/dL (ref 0–199)
HDL: 38 mg/dL — ABNORMAL LOW (ref 40–9999)
LDL Calculated: 120 mg/dL — ABNORMAL HIGH (ref 0–99)
Triglycerides: 115 mg/dL (ref 34–149)
VLDL Calculated: 23 mg/dL (ref 10–40)

## 2020-12-21 LAB — HEMOLYSIS INDEX: Hemolysis Index: 5 (ref 0–24)

## 2020-12-21 LAB — GFR: EGFR: >60

## 2021-01-18 ENCOUNTER — Other Ambulatory Visit (INDEPENDENT_AMBULATORY_CARE_PROVIDER_SITE_OTHER): Payer: Self-pay | Admitting: Internal Medicine

## 2021-01-18 DIAGNOSIS — R079 Chest pain, unspecified: Secondary | ICD-10-CM

## 2022-07-16 ENCOUNTER — Telehealth (INDEPENDENT_AMBULATORY_CARE_PROVIDER_SITE_OTHER): Payer: Self-pay | Admitting: Internal Medicine

## 2022-07-16 NOTE — Telephone Encounter (Signed)
Patient is calling obtain the date of his last Tdap shot and to see if he is due for a Tdap booster.    Please advise.    Patient can be reached at: [phone]     Thank you.

## 2022-08-25 ENCOUNTER — Encounter (INDEPENDENT_AMBULATORY_CARE_PROVIDER_SITE_OTHER): Payer: Self-pay

## 2022-08-28 ENCOUNTER — Encounter (INDEPENDENT_AMBULATORY_CARE_PROVIDER_SITE_OTHER): Payer: Commercial Managed Care - HMO | Admitting: Family

## 2022-08-29 ENCOUNTER — Ambulatory Visit (FREE_STANDING_LABORATORY_FACILITY): Payer: Commercial Managed Care - POS | Admitting: Medical

## 2022-08-29 ENCOUNTER — Encounter (INDEPENDENT_AMBULATORY_CARE_PROVIDER_SITE_OTHER): Payer: Self-pay | Admitting: Medical

## 2022-08-29 VITALS — BP 104/70 | HR 84 | Temp 99.0°F | Resp 20 | Ht 70.0 in | Wt 184.0 lb

## 2022-08-29 DIAGNOSIS — Z23 Encounter for immunization: Secondary | ICD-10-CM

## 2022-08-29 DIAGNOSIS — Z1159 Encounter for screening for other viral diseases: Secondary | ICD-10-CM

## 2022-08-29 DIAGNOSIS — Z91018 Allergy to other foods: Secondary | ICD-10-CM

## 2022-08-29 DIAGNOSIS — Z0000 Encounter for general adult medical examination without abnormal findings: Secondary | ICD-10-CM

## 2022-08-29 DIAGNOSIS — Z8349 Family history of other endocrine, nutritional and metabolic diseases: Secondary | ICD-10-CM

## 2022-08-29 DIAGNOSIS — L409 Psoriasis, unspecified: Secondary | ICD-10-CM

## 2022-08-29 LAB — COMPREHENSIVE METABOLIC PANEL
ALT: 26 U/L (ref 0–55)
AST (SGOT): 21 U/L (ref 5–41)
Albumin/Globulin Ratio: 1.8 (ref 0.9–2.2)
Albumin: 4.2 g/dL (ref 3.5–5.0)
Alkaline Phosphatase: 50 U/L (ref 37–117)
Anion Gap: 9 (ref 5.0–15.0)
BUN: 16 mg/dL (ref 9.0–28.0)
Bilirubin, Total: 0.8 mg/dL (ref 0.2–1.2)
CO2: 23 mEq/L (ref 17–29)
Calcium: 9.4 mg/dL (ref 8.5–10.5)
Chloride: 105 mEq/L (ref 99–111)
Creatinine: 1 mg/dL (ref 0.5–1.5)
Globulin: 2.4 g/dL (ref 2.0–3.6)
Glucose: 82 mg/dL (ref 70–100)
Potassium: 4.4 mEq/L (ref 3.5–5.3)
Protein, Total: 6.6 g/dL (ref 6.0–8.3)
Sodium: 137 mEq/L (ref 135–145)
eGFR: >60 mL/min/{1.73_m2} (ref >=60)

## 2022-08-29 LAB — LIPID PANEL
Cholesterol / HDL Ratio: 4 Index
Cholesterol: 148 mg/dL (ref 0–199)
HDL: 37 mg/dL — ABNORMAL LOW (ref 40–9999)
LDL Calculated: 100 mg/dL — AB (ref 0–99)
Triglycerides: 54 mg/dL (ref 34–149)
VLDL Calculated: 11 mg/dL (ref 10–40)

## 2022-08-29 LAB — CBC AND DIFFERENTIAL
Absolute NRBC: 0 10*3/uL (ref 0.00–0.00)
Basophils Absolute Automated: 0.03 10*3/uL (ref 0.00–0.08)
Basophils Automated: 0.6 %
Eosinophils Absolute Automated: 0.25 10*3/uL (ref 0.00–0.44)
Eosinophils Automated: 4.9 %
Hematocrit: 43.9 % (ref 37.6–49.6)
Hgb: 15 g/dL (ref 12.5–17.1)
Immature Granulocytes Absolute: 0.02 10*3/uL (ref 0.00–0.07)
Immature Granulocytes: 0.4 %
Instrument Absolute Neutrophil Count: 3.01 10*3/uL (ref 1.10–6.33)
Lymphocytes Absolute Automated: 1.54 10*3/uL (ref 0.42–3.22)
Lymphocytes Automated: 30.1 %
MCH: 29.8 pg (ref 25.1–33.5)
MCHC: 34.2 g/dL (ref 31.5–35.8)
MCV: 87.1 fL (ref 78.0–96.0)
MPV: 10.3 fL (ref 8.9–12.5)
Monocytes Absolute Automated: 0.26 10*3/uL (ref 0.21–0.85)
Monocytes: 5.1 %
Neutrophils Absolute: 3.01 10*3/uL (ref 1.10–6.33)
Neutrophils: 58.9 %
Nucleated RBC: 0 /100 WBC (ref 0.0–0.0)
Platelets: 297 10*3/uL (ref 142–346)
RBC: 5.04 10*6/uL (ref 4.20–5.90)
RDW: 12 % (ref 11–15)
WBC: 5.11 10*3/uL (ref 3.10–9.50)

## 2022-08-29 LAB — HEPATITIS C ANTIBODY: Hepatitis C, AB: NONREACTIVE

## 2022-08-29 LAB — HEMOLYSIS INDEX: Hemolysis Index: 4 Index (ref 0–24)

## 2022-08-29 LAB — TSH: TSH: 1.13 u[IU]/mL (ref 0.35–4.94)

## 2022-08-29 MED ORDER — EPIPEN 2-PAK 0.3 MG/0.3ML IJ SOAJ
0.3000 mg | Freq: Once | INTRAMUSCULAR | 0 refills | Status: DC
Start: 2022-08-29 — End: 2022-10-20

## 2022-08-29 NOTE — Progress Notes (Signed)
[facility] INTERNAL MEDICINE - AN [place] PARTNER                  Date of Exam: 08/29/2022 9:32 AM        Patient ID: David Macias is a 31 y.o. male.        Chief Complaint:    Chief Complaint   Patient presents with    Annual Exam             HPI:    HPI  Visit Type: Health Maintenance Visit  Reported Health: good health  Dental: dentist visit within 6-12 months  Vision: no vision correction needed  Hearing: normal hearing  Immunization Status: Influenza vaccination due and COVID booster due    Here for CPE. Feels well overall. Is fasting for labs. Sees derm annually for psoriasis - well managed with topical creams.            Problem List:    Patient Active Problem List   Diagnosis    Allergy to tree nuts    Atopic dermatitis, unspecified type    History of hematuria    History of pilonidal cyst    Irritable bowel syndrome with constipation    Psoriasis    Family history of thyroid disease             Current Meds:    Outpatient Medications Marked as Taking for the 08/29/22 encounter (Office Visit) with Archie Patten, PA   Medication Sig Dispense Refill    Calcipotriene-Betameth Diprop (Enstilar) 0.005-0.064 % Foam prn (0.005-0.064 %)      clobetasol (TEMOVATE) 0.05 % external solution clobetasol 0.05 % scalp solution   APPLY TO SCALP AT BEDTIME X 180 DAYS       Current Outpatient Medications   Medication Sig Dispense Refill    Calcipotriene-Betameth Diprop (Enstilar) 0.005-0.064 % Foam prn (0.005-0.064 %)      clobetasol (TEMOVATE) 0.05 % external solution clobetasol 0.05 % scalp solution   APPLY TO SCALP AT BEDTIME X 180 DAYS      EPINEPHrine (EPIPEN 2-PAK) 0.3 MG/0.3ML Solution Auto-injector injection Inject 0.3 mLs (0.3 mg) into the muscle once for 1 dose 2 each 0     No current facility-administered medications for this visit.           Allergies:    Allergies   Allergen Reactions    Peanut Oil Anaphylaxis     Tree nuts    Tree Nuts Anaphylaxis             Past Surgical History:    Past  Surgical History:   Procedure Laterality Date    CYSTECTOMY, PILONIDAL  11/07/2016    FOOT SURGERY Left 2013    ankle tendon           Family History:    Family History   Problem Relation Age of Onset    Thyroid cancer Mother     Hyperlipidemia Father     Hypertension Father     Hyperlipidemia Brother     Thyroid disease Maternal Aunt     Thyroid disease Maternal Grandmother     Colon polyps Maternal Grandfather     Heart disease Paternal Grandfather            Social History:    Social History     Tobacco Use    Smoking status: Never    Smokeless tobacco: Never   Vaping Use    Vaping Use: Never used  Substance Use Topics    Alcohol use: Yes     Alcohol/week: 3.0 - 4.0 standard drinks of alcohol     Types: 3 - 4 Standard drinks or equivalent per week    Drug use: Never            The following sections were reviewed this encounter by the provider:   Tobacco  Allergies  Meds  Problems  Med Hx  Surg Hx  Fam Hx  Soc Hx              Vital Signs:    Vitals:    08/29/22 0858   BP: 104/70   BP Site: Left arm   Patient Position: Sitting   Cuff Size: Medium   Pulse: 84   Resp: 20   Temp: 99 F (37.2 C)   TempSrc: Temporal   SpO2: 100%   Weight: 83.5 kg (184 lb)   Height: 1.778 m (5\' 10" )            ROS:    Review of Systems   Constitutional:  Negative for chills, fatigue, fever and unexpected weight change.   HENT:  Negative for congestion, dental problem and sore throat.    Eyes:  Negative for visual disturbance.   Respiratory:  Negative for cough and shortness of breath.    Cardiovascular:  Negative for chest pain and palpitations.   Gastrointestinal:  Negative for abdominal pain, blood in stool, constipation, diarrhea, nausea and vomiting.   Genitourinary:  Negative for dysuria, frequency and hematuria.   Musculoskeletal:  Negative for arthralgias and back pain.   Skin:  Negative for rash.   Neurological:  Negative for dizziness and headaches.   Hematological:  Negative for adenopathy.   Psychiatric/Behavioral:   Negative for dysphoric mood and sleep disturbance. The patient is not nervous/anxious.               Physical Exam:    Physical Exam  Vitals reviewed.   Constitutional:       Appearance: Normal appearance.   HENT:      Head: Normocephalic and atraumatic.      Right Ear: Hearing, tympanic membrane, ear canal and external ear normal.      Left Ear: Hearing, tympanic membrane, ear canal and external ear normal.      Nose: Nose normal.      Mouth/Throat:      Lips: Pink.      Mouth: Mucous membranes are moist.      Tongue: No lesions.      Palate: No mass and lesions.      Pharynx: No pharyngeal swelling, oropharyngeal exudate, posterior oropharyngeal erythema or uvula swelling.      Tonsils: No tonsillar exudate.   Eyes:      General: Lids are normal.      Extraocular Movements: Extraocular movements intact.      Conjunctiva/sclera: Conjunctivae normal.      Pupils: Pupils are equal, round, and reactive to light.   Neck:      Thyroid: No thyroid mass, thyromegaly or thyroid tenderness.   Cardiovascular:      Rate and Rhythm: Normal rate and regular rhythm.      Pulses:           Posterior tibial pulses are 2+ on the right side and 2+ on the left side.      Heart sounds: Normal heart sounds. No murmur heard.  Pulmonary:      Effort: Pulmonary effort is  normal.      Breath sounds: Normal breath sounds.   Abdominal:      General: Bowel sounds are normal. There is no distension.      Palpations: Abdomen is soft.      Tenderness: There is no abdominal tenderness. There is no guarding.   Musculoskeletal:         General: Normal range of motion.      Cervical back: Neck supple.      Right lower leg: No edema.      Left lower leg: No edema.   Lymphadenopathy:      Cervical: No cervical adenopathy.   Skin:     General: Skin is warm.      Findings: No rash.   Neurological:      General: No focal deficit present.      Mental Status: He is alert and oriented to person, place, and time.   Psychiatric:         Mood and Affect: Mood  and affect normal.              Assessment/Plan:    Health Maintenance:  1. Routine medical exam  -patient is healthy  -physical exam performed  -check fasting labs today  -encouraged regular exercise, 30-16minutes a day, 3-4 days a week  -recommend healthy, low fat/low carb diet  -recommend flu and COVID vaccines this fall, rest of vaccines up to date  -rest of health maintenance up to date     - CBC and differential  - Comprehensive metabolic panel  - Lipid panel    2. Psoriasis  -stable, well managed with topical creams  -f/u as planned with derm  -f/u as needed    3. Family history of thyroid disease  -mom with thyroid cancer, mat aunt and MGM with thyroid disease  -screening TSH today  -f/u pending labs    - TSH    4. Allergy to tree nuts  -peanuts and tree nuts  -refilled epi-pen  -still must got to the ER if pen is used  -f/u as needed    - EPINEPHrine (EPIPEN 2-PAK) 0.3 MG/0.3ML Solution Auto-injector injection; Inject 0.3 mLs (0.3 mg) into the muscle once for 1 dose  Dispense: 2 each; Refill: 0    5. Need for hepatitis C screening test  - Hepatitis C (HCV) antibody, Total         Please see additional encounter for co-signature reviewed by Dr. Chipper Herb               Follow-up:    Return in about 1 year (around 08/30/2023) for physical.         Archie Patten, PA

## 2022-09-11 ENCOUNTER — Encounter (INDEPENDENT_AMBULATORY_CARE_PROVIDER_SITE_OTHER): Payer: Self-pay | Admitting: Internal Medicine

## 2022-09-11 NOTE — Progress Notes (Signed)
Chart Review Note   I have reviewed the history and physical exam and findings, and agree with the documented plan.  Aalaya Yadao, MD

## 2022-09-26 ENCOUNTER — Ambulatory Visit (INDEPENDENT_AMBULATORY_CARE_PROVIDER_SITE_OTHER): Payer: Commercial Managed Care - POS | Admitting: Physician Assistant

## 2022-09-26 ENCOUNTER — Ambulatory Visit (INDEPENDENT_AMBULATORY_CARE_PROVIDER_SITE_OTHER): Payer: Commercial Managed Care - POS

## 2022-09-26 ENCOUNTER — Encounter (INDEPENDENT_AMBULATORY_CARE_PROVIDER_SITE_OTHER): Payer: Self-pay | Admitting: Physician Assistant

## 2022-09-26 DIAGNOSIS — S6981XA Other specified injuries of right wrist, hand and finger(s), initial encounter: Secondary | ICD-10-CM

## 2022-09-26 NOTE — Progress Notes (Signed)
[facility]    Provider: Harvest Forest, PA  Date of Exam:  09/26/2022   Patient:  David Macias  DOB:  01/19/1991    AGE:  31 y.o.  MR#:  [id number]     Chief Complaint: Right wrist pain.    HPI:  David Macias is a pleasant 31 y.o.-year-old male who presents today with a history of right wrist injury that occurred 1 week ago.  He is right-hand dominant.  He states that he was participating in training exercises for work when he felt a painful pop and sensation within his wrist.  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  He denies noticing any swelling or bruising after this injury event.  He was able to continue to finish his training exercises that day.  Since then he has noted a mild to moderate improvement in his pain symptoms.  He has been wearing a wrist problem splint brace that he had from a prior injury which he noted provided moderate relief of his pain symptoms.  He has continued with conservative measures at home for management of this condition. David Macias is now here for further evaluation and discussion of treatment options.    For other past medical history, social history, family history and past surgical history please see them listed below.    Problem List:   Patient Active Problem List   Diagnosis    Allergy to tree nuts    Atopic dermatitis, unspecified type    History of hematuria    History of pilonidal cyst    Irritable bowel syndrome with constipation    Psoriasis    Family history of thyroid disease      Past Medical History:  History reviewed. No pertinent past medical history.    Social History:   Social History     Tobacco Use    Smoking status: Never    Smokeless tobacco: Never   Vaping Use    Vaping Use: Never used   Substance Use Topics    Alcohol use: Yes     Alcohol/week:  3.0 - 4.0 standard drinks of alcohol     Types: 3 - 4 Standard drinks or equivalent per week    Drug use: Never     Family History:   Family History   Problem Relation Age of Onset    Thyroid cancer Mother     Hyperlipidemia Father     Hypertension Father     Hyperlipidemia Brother     Thyroid disease Maternal Aunt     Thyroid disease Maternal Grandmother     Colon polyps Maternal Grandfather     Heart disease Paternal Grandfather      Past Surgical History:    Past Surgical History:   Procedure Laterality Date    CYSTECTOMY, PILONIDAL  11/07/2016    FOOT SURGERY Left 2013    ankle tendon     Medications: Scheduled Meds:  Continuous Infusions:  PRN Meds:.    Current Outpatient Medications:     Calcipotriene-Betameth Diprop (Enstilar) 0.005-0.064 % Foam, prn (0.005-0.064 %), Disp: , Rfl:     clobetasol (TEMOVATE) 0.05 % external solution, clobetasol 0.05 % scalp solution  APPLY TO SCALP AT BEDTIME X 180 DAYS, Disp: , Rfl:  Allergies:    Allergies   Allergen Reactions    Peanut Oil Anaphylaxis     Tree nuts    Tree Nuts Anaphylaxis    Peanut Extract Allergy Skin Test      ROS:  Constitutional: No fatigue, fever, weight loss, or weight gain.   Ears, Nose, Mouth & Throat: No sore throat or hearing loss.   Cardiovascular: No chest pain, blood clots, or leg cramps.   Respiratory: No shortness of breath, cough, or difficulty breathing.   Gastrointestinal: No nausea, vomiting, diarrhea, or loss of appetite.   Genitourinary: No polyuria or kidney disease.   Musculoskeletal: No joint aches, muscle weakness or swelling of joints/body parts other than that mentioned above.  Integumentary: No finger nail changes or skin dryness.   Neurological: No numbness, burning discomfort, or headaches.   Psychiatric: No depression or anxiety.   Endocrine: No increased thirst, change in appetite or thyroid disease.   Hematologic/Lymphatic: No easy bruising or anemia.     Exam:   Right Wrist  Observation:  normal; no swelling or  deformity; no apparent soft tissue edema  Range of motion: Dorsiflexion 70 Deg; Palmar flexion 80 Deg; Radial deviation 20 Deg; Ulnar deviation 30 Deg; Supination 60 Deg; Pronation 60 Deg  Palpation:  nontender at Radial Styloid, Ulnar Styloid, Snuffbox, 1st CMC joint, Distal Radius Shaft, Distal Ulna Shaft, TFCC, Dorsal Lunate, DRUJ  UCL stress testing:  Negative  RCL stress testing:  Negative  Strength testing: Globally  - 5 / 5                                  Wrist Extension  - 5 / 5 does not reproduce pain                                 Wrist Flexion   - 5 / 5 does not reproduce pain                                 Wrist Supination - 5 / 5                                  Wrist Pronation   - 5 / 5                                  Grip strength   - 5 / 5   Finkelstein's:  Negative  Adductor Pollicis Stress Test:  Negative  Watson's Test:  Negative  TFCC Grind:  Negative  Push-off Test for TFCC:  Positive  Phalen:  Negative  Tinel's:  Negative  Distal Sensory:  normal  Distal pulses:  intact  Skin & Nails:  normal     Vitals: There were no vitals taken for this visit.    General: David Macias was awake, alert, oriented, easily engaged, displayed logical thinking with clear speech and was neat in appearance. His general appearance was normal, well-developed and well-nourished.    Gait: The patient demonstrated non antalgic gait with intact coordination and balance.     Studies: X-ray wrist 3 views ordered reviewed during today's office visit.  No acute fractures.  Appropriate  alignment.  Well-maintained joint spacing.  No widening of the DRUJ.    Assessment/Plan: David Macias is a 31 year old male with historical and clinical evidence of right wrist strain of the TFCC versus TFCC partial tear versus right wrist ulnar tendinitis.  Patient clinical condition and treatment options were discussed at length with him.  The patient notes a left wrist injury that occurred 1 week ago while he was participating in training exercises  for work.  While he was traversing a wall he felt a popping sensation within his wrist.  He denies any direct trauma or falls to his wrist.  He noted moderate discomfort initially however he states that this improved with conservative measures at home.  On exam today he has full range of motion of his right wrist.  No edema.  No ecchymosis.  Nontender to palpation overlying the wrist distal radius distal ulna and metacarpals.  No reproducible instability.  Negative snuffbox tenderness.  Mild pain reproduced to ulnar wrist with pushoff test.  X-rays were ordered and reviewed during today's office visit are described above.  Patient clinical exam and x-ray findings are consistent with the above diagnosis.  Advised continued conservative treatment for this condition at home.  He may continue to use his previously obtained proper breast pain for activity modification and rest of the wrist.  Advised periodically coming out of this to perform range of motion exercises of the wrist.  Also advised use of OTC NSAIDs for management of any inflammation.  He may continue advance his activities as he tolerates.  We will plan on follow-up as needed.  He may return sooner for any worsening of his condition or nonimprovement.    Elements of MDM:   30 minutes was spent in care of the patient at today's visit     The review of the patient's medications does not in any way constitute an endorsement, by this clinician,  of their use, dosage, indications, route, efficacy, interactions, or other clinical parameters.    This note may have been generated in part or in whole within the EP[employer] EMR using Dragon medical speech recognition software and may contain inherent errors or omissions not intended by the user. Grammatical and punctuation errors, random word insertions, deletions, pronoun errors and incomplete sentences are occasional consequences of this technology due to software limitations. Not all errors are caught or corrected.   Although every attempt is made to root out erroneus and incomplete transcription, the note may still not fully represent the intent or opinion of the author. If there are questions or concerns about the content of this note or information contained within the body of this dictation they should be addressed directly with the author for clarification.     Irish Elders, PA, have personally performed the history, physical exam and medical decision making; and confirmed the accuracy of the information in the transcribed note.    Date: 09/26/2022 Time: 8:15 AM

## 2022-10-17 ENCOUNTER — Other Ambulatory Visit: Payer: Self-pay

## 2022-10-17 DIAGNOSIS — Z91018 Allergy to other foods: Secondary | ICD-10-CM

## 2022-10-17 NOTE — Telephone Encounter (Signed)
Name, strength, directions of requested refill(s):  EPINEPHrine (EPIPEN 2-PAK) 0.3 MG/0.3ML Solution Auto-injector injection     How much medication is remaining:     Pharmacy to send refill to or patient to pick up rx from office (mark requested pharmacy in BOLD):      [id number] [place], [place] - [address] AT [facility] -[address] LEVEL  [address]  [id number]  [place] [address]  Phone: [phone] Fax: [phone]        Please mark "X" next to the preferred call back number:    Mobile: [phone] (mobile)    Home: [phone] (home)    Work: @WORKPHONE @        Medication refill request, see above. Thank you   Patient has been informed that medication refill requests should be called in up to one week prior to running out of medication.    Additional Notes:  Next Visit: MM/DD/YY

## 2022-10-20 MED ORDER — EPIPEN 2-PAK 0.3 MG/0.3ML IJ SOAJ: 0.3000 mg | Freq: Once | INTRAMUSCULAR | 0 refills | Status: DC

## 2022-10-20 NOTE — Telephone Encounter (Signed)
Last filled 08/29/22 but rx expired  Last appt 08/29/22  Pt does not have an upcoming appointment. .  Rec f/u: 08/30/23

## 2023-02-24 ENCOUNTER — Ambulatory Visit (INDEPENDENT_AMBULATORY_CARE_PROVIDER_SITE_OTHER): Payer: Commercial Managed Care - HMO | Admitting: Physician Assistant

## 2023-02-24 ENCOUNTER — Encounter (INDEPENDENT_AMBULATORY_CARE_PROVIDER_SITE_OTHER): Payer: Self-pay | Admitting: Physician Assistant

## 2023-02-24 VITALS — BP 109/65 | HR 65 | Temp 97.6°F | Resp 18 | Wt 189.0 lb

## 2023-02-24 DIAGNOSIS — J4531 Mild persistent asthma with (acute) exacerbation: Secondary | ICD-10-CM

## 2023-02-24 DIAGNOSIS — K120 Recurrent oral aphthae: Secondary | ICD-10-CM

## 2023-02-24 DIAGNOSIS — J069 Acute upper respiratory infection, unspecified: Secondary | ICD-10-CM

## 2023-02-24 DIAGNOSIS — J019 Acute sinusitis, unspecified: Secondary | ICD-10-CM

## 2023-02-24 DIAGNOSIS — B9689 Other specified bacterial agents as the cause of diseases classified elsewhere: Secondary | ICD-10-CM

## 2023-02-24 MED ORDER — AMOXICILLIN-POT CLAVULANATE 875-125 MG PO TABS
1.0000 | ORAL_TABLET | Freq: Two times a day (BID) | ORAL | 0 refills | Status: AC
Start: 2023-02-24 — End: 2023-03-04

## 2023-02-24 NOTE — Patient Instructions (Signed)
FOR SINUS INFECTION:    Take antibiotic UNTIL GONE.  Take 1-2 servings of YOGURT each day or any PROBIOTIC of choice per day to help maintain intestinal good bacteria.    If you do not have heart disease or high blood pressure, you can consider adding OTC "pseudoephedrine " (Short-acting, generic, ask the pharmacist for this), temporarily for sinus and ear congestion.        Increase water intake, goal 1-2 liters or more per 24 hours while ill.    Consider taking a couple warm showers per day to promote sinus drainage.    Increase rest while ill. "Voice rest" if you are having hoarseness.    For more severe congestion or if air travel planned, you can try careful use of "AFRIN" nasal spray. I recommend a trial of 1/2 spray in one nostril per night at bedtime; just alternate to the other nostril the next night. Use 3-4 bed times maximum.  Remember, AFRIN is associated with a "rebound" effect as it wears off. Do not take more AFRIN to treat rebound. Generally, discontinue AFRIN as soon as possible.    You can try a Sinus rinse product such as "Neti-Pot" or one of the other "Neil Med" products; just follow label directions.     For crusting and dryness of nasal passages, consider use of a cool mist vaporizer (especially in dry winter months) and/or a lubricating saline nasal spray.         Use Advil (ibuprofen) or Tylenol (acetaminophen) for pain as needed:    If you do not have KIDNEY DISEASE or STOMACH PROBLEMS, use a) Ibuprofen 200mg 1-4 tabs every 6-8 hours for pain and fever, OR, alternatively to IBUPROFEN, you can use b) ALEVE 1-2 tab twice daily with food.  ONLY use 1 anti-inflammatory drug at a time.    With EITHER one or the other anti-inflammatory suggested above, you can also ADD ON Extra Strength Tylenol 500mg 2 tab up to four times per day if needed for additional pain and fever relief.          Return to clinic or Video Visit, or call  if not getting better after a week or so, or anytime if worse such  as fever greater than 100, vomiting or unable to keep water down, or any other concern.       Acute Bacterial Rhinosinusitis (ABRS)    Acute bacterial rhinosinusitis (ABRS) is an infection of your nasal cavity and sinuses. It's caused by bacteria. Acute means that you've had symptoms for less than 4 weeks, but possibly up to 12 weeks.  Understanding your sinuses  The nasal cavity is the large air-filled space behind your nose. The sinuses are a group of spaces formed by the bones of your face. They connect with your nasal cavity. ABRS causes the tissue lining these spaces to become inflamed. Mucus may not drain normally. This leads to facial pain and other symptoms.  What causes ABRS?  ABRS most often follows an upper respiratory infection caused by a virus. Bacteria then infect the lining of your nasal cavity and sinuses. But you can also get ABRS if you have:  Nasal allergies  Long-term nasal swelling and congestion not caused by allergies  Blockage in the nose  Symptoms of ABRS  The symptoms of ABRS may be different for each person and include:  Nasal congestion or blockage  Pain or pressure in the face  Thick, colored drainage from the nose  Other symptoms may   include:  Runny nose  Fluid draining from the nose down the throat (postnasal drip)  Headache  Cough  Pain  Fever  Diagnosing ABRS  ABRS may be diagnosed if you've had an upper respiratory infection like a cold and cough for 10 or more days without improvement or with worsening symptoms. Your healthcare provider will ask about your symptoms and your medical history. The provider will check your vital signs, including your temperature. You'll have a physical exam. The healthcare provider will check your ears, nose, and throat. You likely won't need any tests. If ABRS comes back, you may have a culture or other tests.  Treatment for ABRS  Treatment may include:  Antibiotic medicine. This is for symptoms that last for at least 10 to 14 days.  Nasal  corticosteroid medicine. Drops or spray used in the nose can lessen swelling and congestion.  Over-the-counter pain medicine. This is to lessen sinus pain and pressure.  Nasal decongestant medicine. Spray or drops may help to lessen congestion. Do not use them for more than a few days.  Salt wash (saline irrigation). This can help to loosen mucus.  Possible complications of ABRS  ABRS may come back or become long-term (chronic). In rare cases, ABRS may cause complications such as:   Inflamed tissue around the brain and spinal cord (meningitis)  Inflamed tissue around the eyes (orbital cellulitis)  Inflamed bones around the sinuses (osteitis)  These problems may need to be treated in a hospital with intravenous (IV) antibiotic medicine or surgery.  When to call the healthcare provider  Call your healthcare provider if you have any of the following:  Symptoms that don't get better, or get worse  Symptoms that don't get better after 3 to 5 days on antibiotics  Trouble seeing  Swelling around your eyes  Confusion or trouble staying awake   StayWell last reviewed this educational content on 04/07/2016

## 2023-02-24 NOTE — Progress Notes (Signed)
[facility]    Date: 02/24/2023 10:16 PM   Patient ID: David Macias is a 32 y.o. male.    Subjective:     Chief Complaint:  Chief Complaint   Patient presents with    Cough     X 4 weeks; worse at night and morning;        HPI:  HPI    Patient complains of URI symptoms that started around 3 1/2 weeks ago   Now with persistent cough that is worst in AM and evening  Also a bump on roof of mouth that is tender  Some nasal discharge in the AM, less congestion than before tho    No fevers  Denies asthma or wheezing. He thinks he was wheezing early in the illness  No chest pain or shortness of breath.    No better with OTC's  No known ill contacts or recent travel.   No known Influenza or Covid positive contacts.      Problem List:  Patient Active Problem List   Diagnosis    Allergy to tree nuts    Atopic dermatitis, unspecified type    History of hematuria    History of pilonidal cyst    Irritable bowel syndrome with constipation    Psoriasis    Family history of thyroid disease       Current Medications:  Outpatient Medications Marked as Taking for the 02/24/23 encounter (Office Visit) with Bari Mantis, PA   Medication Sig Dispense Refill    Calcipotriene-Betameth Diprop (Enstilar) 0.005-0.064 % Foam prn (0.005-0.064 %)      clobetasol (TEMOVATE) 0.05 % external solution clobetasol 0.05 % scalp solution   APPLY TO SCALP AT BEDTIME X 180 DAYS           Allergies:  Allergies   Allergen Reactions    Peanut Oil Anaphylaxis     Tree nuts    Tree Nuts Anaphylaxis    Peanut Extract Allergy Skin Test        Past Medical History:  History reviewed. No pertinent past medical history.    Past Surgical History:  Past Surgical History:   Procedure Laterality Date    CYSTECTOMY, PILONIDAL  11/07/2016    FOOT SURGERY Left 2013    ankle tendon       Family History:  Family History   Problem Relation Age of Onset    Thyroid cancer Mother     Hyperlipidemia Father     Hypertension Father      Hyperlipidemia Brother     Thyroid disease Maternal Aunt     Thyroid disease Maternal Grandmother     Colon polyps Maternal Grandfather     Heart disease Paternal Grandfather        Social History:  Social History     Tobacco Use    Smoking status: Never    Smokeless tobacco: Never   Vaping Use    Vaping status: Never Used   Substance Use Topics    Alcohol use: Yes     Alcohol/week: 3.0 - 4.0 standard drinks of alcohol     Types: 3 - 4 Standard drinks or equivalent per week    Drug use: Never          The following sections were reviewed this encounter by the provider:   Tobacco  Allergies  Meds  Problems  Med Hx  Surg Hx  Fam Hx  ROS:  Review of Systems    General/Constitutional:   See HPI       Objective:   Vitals:  Vitals:    02/24/23 1408   BP: 109/65   BP Site: Left arm   Patient Position: Sitting   Cuff Size: Large   Pulse: 65   Resp: 18   Temp: 97.6 F (36.4 C)   TempSrc: Temporal   SpO2: 98%   Weight: 85.7 kg (189 lb)       Physical Exam:  General Examination:   GENERAL APPEARANCE: alert, in no acute distress, well developed, well nourished, oriented to time, place, and person.   HEENT: Conjunctiva clear B/L, PERL, EOMI. TM's intact without erythema bilaterally, bilateral EAC's clear,  there is an area of mild redness on hard palate midline, no vesicles seen, pharynx clear, no edema, no exudates, no lesions or vesicles, normal lips, mucosa moist.   NECK/THYROID: neck supple,  no cervical lymphadenopathy  LUNGS: normal effort / no distress, CTAB.  HEART: regular rate and rhythm    NEUROLOGIC: alert and oriented.   Physical Exam       Assessment:       1. Acute bacterial sinusitis  - amoxicillin-clavulanate (AUGMENTIN) 875-125 MG per tablet; Take 1 tablet by mouth 2 (two) times daily for 8 days  Dispense: 16 tablet; Refill: 0    2. Acute upper respiratory infection  - XR Chest 2 Views; Future    3. Aphthous stomatitis    4. Mild persistent asthmatic bronchitis with acute exacerbation    Orders  Placed This Encounter   Procedures    XR Chest 2 Views     Standing Status:   Future     Standing Expiration Date:   03/27/2023     Order Specific Question:   Reason for Exam:     Answer:   cough     Order Specific Question:   Release to patient     Answer:   Immediate          Plan:     Increasing drainage and pressure across all sinuses with persistent nighttime PND  Persistent generalized head congestion.     Will start appropriate antibiotic treatment and supportive care recommendations given in patient instructions    Patient will f/u if no better 10-14 days or if symptoms improve and then relapse.    Check CXR due to duration of cough and advise further if abnormal    Reassured the aphthous sore process should be self limited, recommend pain control such as Ibuprofen, add on Tylenol, add on topical lidocaine or Anbesol.       Return if symptoms worsen or fail to improve.    Lendell Caprice PA-C  [facility]  [phone] [address]  [place] [phone]  [place], [place] [address]  [phone]  Fax [phone]

## 2023-03-31 ENCOUNTER — Telehealth (INDEPENDENT_AMBULATORY_CARE_PROVIDER_SITE_OTHER): Payer: Self-pay | Admitting: Internal Medicine

## 2023-03-31 DIAGNOSIS — L409 Psoriasis, unspecified: Secondary | ICD-10-CM

## 2023-03-31 NOTE — Telephone Encounter (Signed)
A request for the following medication has been requested:    clobetasol (TEMOVATE) 0.05 % external solution

## 2023-04-01 MED ORDER — CLOBETASOL PROPIONATE 0.05 % EX SOLN: Freq: Two times a day (BID) | CUTANEOUS | 1 refills | Status: DC

## 2023-04-01 NOTE — Telephone Encounter (Signed)
Refilled. Should come from derm in the future

## 2024-11-25 ENCOUNTER — Ambulatory Visit (INDEPENDENT_AMBULATORY_CARE_PROVIDER_SITE_OTHER): Admitting: Medical

## 2024-11-25 ENCOUNTER — Encounter (INDEPENDENT_AMBULATORY_CARE_PROVIDER_SITE_OTHER): Payer: Self-pay | Admitting: Medical

## 2024-11-25 VITALS — BP 130/75 | HR 74 | Temp 98.7°F | Resp 14 | Ht 70.87 in | Wt 184.0 lb

## 2024-11-25 DIAGNOSIS — L409 Psoriasis, unspecified: Secondary | ICD-10-CM

## 2024-11-25 DIAGNOSIS — Z91010 Allergy to peanuts: Secondary | ICD-10-CM

## 2024-11-25 DIAGNOSIS — Z91018 Allergy to other foods: Secondary | ICD-10-CM

## 2024-11-25 MED ORDER — CLOBETASOL PROPIONATE 0.05 % EX SOLN: Freq: Two times a day (BID) | CUTANEOUS | 1 refills | Status: AC

## 2024-11-25 MED ORDER — ENSTILAR 0.005-0.064 % EX FOAM: 1.0000 | Freq: Every day | CUTANEOUS | 1 refills | Status: AC

## 2024-11-25 MED ORDER — EPINEPHRINE 0.3 MG/0.3ML IJ SOAJ: 0.3000 mg | Freq: Once | INTRAMUSCULAR | 1 refills | Status: AC | PRN

## 2024-11-25 MED ORDER — AUVI-Q 0.3 MG/0.3ML IJ SOAJ: 0.3000 mg | Freq: Once | INTRAMUSCULAR | 1 refills | Status: AC

## 2024-11-25 NOTE — Assessment & Plan Note (Signed)
-  tree nut and peanut allergies  -rx for epi-pen as below sent to pharm  -avoid triggers  -to ER for monitoring if pen is used  -f/u as needed    Orders:    EPINEPHrine  0.3 MG/0.3ML auto-injector; Inject 0.3 mLs (0.3 mg) into the muscle once as needed (severe allergic reaction)

## 2024-11-25 NOTE — Progress Notes (Signed)
 Have you seen any specialists/other providers since your last visit with us ?    No    The patient was informed that the following HM items are still outstanding:     Health Maintenance Due   Topic Date Due    DEPRESSION SCREENING  08/30/2023    Annual Exam  08/30/2023    Influenza Vaccine  07/08/2024

## 2024-11-25 NOTE — Progress Notes (Signed)
 [facility]   OFFICE VISIT                HPI     Chief Complaint   Patient presents with    Medication Refill     Epi pen         HPI    Here for some refills. Is currently stationed in [place] with the [employer]. Will be there til next summer then has a posting in [place] planned. Needs a refill on his epi-pens. Has tree nut and peanut allergies. Would like a refill on both the traditional pen as well as an Auvi-Q  as that is easier to carry around. Would also like a refill on his psoriasis meds. Uses clobetasol  on his scalp, Enstilar  on non-scalp areas (knee, ear). Meds work well. Sees [facility] but not able to get a visit in the short period that he's currently home for. Feels well overall    ROS   Review of Systems   Constitutional:  Negative for fatigue, fever and unexpected weight change.   Respiratory:  Negative for cough, chest tightness and shortness of breath.    Cardiovascular:  Negative for chest pain, palpitations and leg swelling.   Skin:  Positive for rash (psoriasis).   Allergic/Immunologic: Positive for food allergies.   Neurological:  Negative for dizziness and headaches.   Psychiatric/Behavioral:  Negative for behavioral problems.        Vital Signs   BP 130/75 (BP Site: Left arm, Patient Position: Sitting, Cuff Size: Large)   Pulse 74   Temp 98.7 F (37.1 C) (Oral)   Resp 14   Ht 1.8 m (5' 10.87)   Wt 83.5 kg (184 lb)   SpO2 98%   BMI 25.76 kg/m   Physical Exam   Physical Exam  Vitals reviewed.   Constitutional:       Appearance: Normal appearance.   HENT:      Head: Normocephalic and atraumatic.   Cardiovascular:      Rate and Rhythm: Normal rate and regular rhythm.      Heart sounds: Normal heart sounds. No murmur heard.  Pulmonary:      Effort: Pulmonary effort is normal.      Breath sounds: Normal breath sounds.   Skin:     General: Skin is warm.      Findings: No rash.   Neurological:      General: No focal deficit present.      Mental Status: He is alert and  oriented to person, place, and time.   Psychiatric:         Mood and Affect: Mood normal.         Behavior: Behavior normal.        Assessment/Plan     Assessment & Plan  Psoriasis  -stable with prn topical treatment  -refilled both meds as below  -gentle cleansers  -sunscreen use encouraged  -f/u as needed with derm when back in the [place]  -f/u 67mo for CPE when back in the [place]    Orders:    Calcipotriene-Betameth Diprop (Enstilar ) 0.005-0.064 % Foam; Apply 1 .application topically once daily Use for up to 4 weeks    clobetasol  (TEMOVATE ) 0.05 % external solution; Apply topically 2 (two) times daily Use for up 2 weeks at a time    Allergy to tree nuts  -tree nut and peanut allergies  -rx for epi-pen as below sent to pharm  -avoid triggers  -to ER for  monitoring if pen is used  -f/u as needed    Orders:    EPINEPHrine  0.3 MG/0.3ML auto-injector; Inject 0.3 mLs (0.3 mg) into the muscle once as needed (severe allergic reaction)    Peanut allergy  -peanuts and tree nuts  -rx for Auvi-Q  as below sent to pharm  -avoid triggers  -to ER if pen is used for monitoring  -f/u as needed    Orders:    EPINEPHrine  (AUVI-Q ) 0.3 MG/0.3ML Solution Auto-injector injection; Inject 0.3 mLs (0.3 mg) into the muscle once

## 2024-11-25 NOTE — Assessment & Plan Note (Addendum)
-  stable with prn topical treatment  -refilled both meds as below  -gentle cleansers  -sunscreen use encouraged  -f/u as needed with derm when back [place] the [place]  -f/u 9mo for CPE when back in the [place]    Orders:    Calcipotriene-Betameth Diprop (Enstilar ) 0.005-0.064 % Foam; Apply 1 .application topically once daily Use for up to 4 weeks    clobetasol  (TEMOVATE ) 0.05 % external solution; Apply topically 2 (two) times daily Use for up 2 weeks at a time

## 2024-12-06 ENCOUNTER — Telehealth: Payer: Self-pay | Admitting: Medical

## 2024-12-06 NOTE — Telephone Encounter (Addendum)
 Copied from CRM [id number]. Topic: Clinical Support - Speak With Nurse  >> Dec 06, 2024  4:55 PM Ruxanne B wrote:  MERILEE HACKER T called about Clinical Support - Speak With Nurse.  Additional details:    Pt called to let PA Bartlett know that Calcipotriene-Betameth Diprop (Enstilar ) 0.005-0.064 % Foam needs PA.    Call pt if you have any questions [phone].    Please advise.

## 2024-12-12 NOTE — Telephone Encounter (Signed)
 noted

## 2024-12-12 NOTE — Telephone Encounter (Addendum)
 PA Status    Medication: Enstilar  0.005-0.064% foam   Key: [id number]  Status:   []  Awaiting questions  []  Submitted  [x]  Approved  []  Denied  []  Other:
